# Patient Record
Sex: Female | Born: 2000 | Hispanic: Yes | Marital: Single | Smoking: Never smoker
Health system: Southern US, Community
[De-identification: ages and names within clinical notes are randomized; demographics above are authoritative.]

## PROBLEM LIST (undated history)

## (undated) DIAGNOSIS — E7800 Pure hypercholesterolemia, unspecified: Secondary | ICD-10-CM

## (undated) DIAGNOSIS — U071 COVID-19: Secondary | ICD-10-CM

## (undated) HISTORY — DX: COVID-19: U07.1

## (undated) HISTORY — DX: Pure hypercholesterolemia, unspecified: E78.00

## (undated) HISTORY — PX: OTHER SURGICAL HISTORY: SHX169

---
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
## 2019-02-12 ENCOUNTER — Ambulatory Visit: Payer: Medicaid Other | Attending: Internal Medicine

## 2019-02-12 DIAGNOSIS — U071 COVID-19: Secondary | ICD-10-CM | POA: Insufficient documentation

## 2019-02-12 DIAGNOSIS — Z20822 Contact with and (suspected) exposure to covid-19: Secondary | ICD-10-CM

## 2019-02-13 LAB — NOVEL CORONAVIRUS, NAA: SARS-CoV-2, NAA: DETECTED — AB

## 2019-02-24 ENCOUNTER — Other Ambulatory Visit: Payer: Self-pay

## 2019-02-24 ENCOUNTER — Ambulatory Visit: Payer: Medicaid Other

## 2019-02-24 VITALS — BP 98/62 | Ht 66.0 in | Wt 170.5 lb

## 2019-02-24 DIAGNOSIS — Z3201 Encounter for pregnancy test, result positive: Secondary | ICD-10-CM

## 2019-02-24 LAB — PREGNANCY, URINE: Preg Test, Ur: POSITIVE — AB

## 2019-02-24 NOTE — Progress Notes (Signed)
Pt unsure of prenatal caregiver; sent to preadmit.

## 2019-03-27 NOTE — Progress Notes (Signed)
Record abstracted per 03/26/19 phone interview with Hal Hope, RN; Sharlette Dense

## 2019-03-28 ENCOUNTER — Encounter: Payer: Self-pay | Admitting: Family Medicine

## 2019-03-28 ENCOUNTER — Other Ambulatory Visit: Payer: Self-pay

## 2019-03-28 ENCOUNTER — Ambulatory Visit: Payer: Medicaid Other | Admitting: Family Medicine

## 2019-03-28 VITALS — BP 106/69 | HR 80 | Temp 97.3°F | Wt 183.2 lb

## 2019-03-28 DIAGNOSIS — F1291 Cannabis use, unspecified, in remission: Secondary | ICD-10-CM

## 2019-03-28 DIAGNOSIS — Z87898 Personal history of other specified conditions: Secondary | ICD-10-CM

## 2019-03-28 DIAGNOSIS — O0930 Supervision of pregnancy with insufficient antenatal care, unspecified trimester: Secondary | ICD-10-CM | POA: Insufficient documentation

## 2019-03-28 DIAGNOSIS — Z3400 Encounter for supervision of normal first pregnancy, unspecified trimester: Secondary | ICD-10-CM

## 2019-03-28 HISTORY — DX: Encounter for supervision of normal first pregnancy, unspecified trimester: Z34.00

## 2019-03-28 HISTORY — DX: Supervision of pregnancy with insufficient antenatal care, unspecified trimester: O09.30

## 2019-03-28 LAB — URINALYSIS
Bilirubin, UA: NEGATIVE
Glucose, UA: NEGATIVE
Ketones, UA: NEGATIVE
Leukocytes,UA: NEGATIVE
Nitrite, UA: NEGATIVE
Protein,UA: NEGATIVE
RBC, UA: NEGATIVE
Specific Gravity, UA: 1.025 (ref 1.005–1.030)
Urobilinogen, Ur: 0.2 mg/dL (ref 0.2–1.0)
pH, UA: 7 (ref 5.0–7.5)

## 2019-03-28 LAB — WET PREP FOR TRICH, YEAST, CLUE
Trichomonas Exam: NEGATIVE
Yeast Exam: NEGATIVE

## 2019-03-28 LAB — HEMOGLOBIN, FINGERSTICK: Hemoglobin: 12.4 g/dL (ref 11.1–15.9)

## 2019-03-28 NOTE — Progress Notes (Signed)
Wet Prep results reviewed. Per standing orders no treatment indicated. Leeza Heiner, RN  

## 2019-03-28 NOTE — Progress Notes (Signed)
Here today for 20.1 week MH IP. Taking PNV Gummies QD. Denies ED/hospital visits since +PT. Quad Screen today. Declines Flu vaccine. Tawny Hopping, RN

## 2019-03-28 NOTE — Progress Notes (Addendum)
[place] [id number] [phone]  INITIAL PRENATAL VISIT NOTE  Subjective:  Patricia Kirby is a 19 y.o. G1P0000 at [redacted]w[redacted]d being seen today to start prenatal care at the [facility].  She is currently monitored for the following issues for this low-risk pregnancy and has Supervision of normal first pregnancy, antepartum and History of marijuana use on their problem list.   Pt presents for initial OB visit today. She currently lives by herself and works as a Advertising account planner. Physically she is feeling well, didn't really have nausea. She has no chronic medical issues and takes no medications daily aside from PNV. She last used alcohol and marijuana at the end of November when learning she was pregnant. Denies other drug use. She has no family hx of genetic abnormalities and elects the quad screen today. Mentally she states she is doing well. Phq-9 score 0, declines beh health referral.      Contractions: Not present. Vag. Bleeding: None.  Movement: Absent. Denies leaking of fluid.   Indications for ASA therapy (per uptodate) One of the following: Previous pregnancy with preeclampsia, especially early onset and with an adverse outcome No Multifetal gestation No Chronic hypertension No Type 1 or 2 diabetes mellitus No Chronic kidney disease No Autoimmune disease (antiphospholipid syndrome, systemic lupus erythematosus) No  Two or more of the following: Nulliparity Yes Obesity (body mass index >30 kg/m2) No Family history of preeclampsia in mother or sister No Age ?35 years No Sociodemographic characteristics (African American race, low socioeconomic level) Yes Personal risk factors (eg, previous pregnancy with low birth weight or small for gestational age infant, previous adverse pregnancy outcome [eg, stillbirth], interval >10 years between pregnancies) No   The  following portions of the patient's history were reviewed and updated as appropriate: allergies, current medications, past family history, past medical history, past social history, past surgical history and problem list. Problem list updated.  Objective:   Vitals:   03/28/19 0858  BP: 106/69  Pulse: 80  Temp: (!) 97.3 F (36.3 C)  Weight: 183 lb 3.2 oz (83.1 kg)    Fetal Status: Fetal Heart Rate (bpm): 130 Fundal Height: 20 cm Movement: Absent  Presentation: Undeterminable   Physical Exam Vitals and nursing note reviewed.  Constitutional:      General: She is not in acute distress.    Appearance: Normal appearance. She is well-developed.  HENT:     Head: Normocephalic and atraumatic.     Right Ear: External ear normal.     Left Ear: External ear normal.     Nose: Nose normal. No congestion or rhinorrhea.     Mouth/Throat:     Lips: Pink.     Mouth: Mucous membranes are moist.     Dentition: Normal dentition. No dental caries.     Pharynx: Oropharynx is clear. Uvula midline.  Eyes:     General: No scleral icterus.    Conjunctiva/sclera: Conjunctivae normal.  Neck:     Thyroid: No thyroid mass or thyromegaly.  Cardiovascular:     Rate and Rhythm: Normal rate.     Pulses: Normal pulses.     Comments: Extremities are warm and well perfused Pulmonary:     Effort: Pulmonary effort is normal.     Breath sounds: Normal breath sounds.  Chest:     Breasts: Breasts are symmetrical.        Right: Normal. No mass, nipple  discharge or skin change.        Left: Normal. No mass, nipple discharge or skin change.     Comments: +bilateral nipple peircings, no erythema or tenderness Abdominal:     General: Abdomen is flat.     Palpations: Abdomen is soft.     Tenderness: There is no abdominal tenderness.     Comments: Gravid   Genitourinary:    General: Normal vulva.     Exam position: Lithotomy position.     Pubic Area: No rash.      Labia:        Right: No rash.        Left:  No rash.      Vagina: Vaginal discharge (scant, white) present.     Cervix: No cervical motion tenderness or friability.     Uterus: Normal. Enlarged (Gravid 20 wk size ). Not tender.      Adnexa: Right adnexa normal and left adnexa normal.     Rectum: Normal. No external hemorrhoid.  Musculoskeletal:     Right lower leg: No edema.     Left lower leg: No edema.  Lymphadenopathy:     Upper Body:     Right upper body: No axillary adenopathy.     Left upper body: No axillary adenopathy.  Skin:    General: Skin is warm.     Capillary Refill: Capillary refill takes less than 2 seconds.  Neurological:     Mental Status: She is alert.     Assessment and Plan:  Pregnancy: G1P0000 at [redacted]w[redacted]d  1. Supervision of normal first pregnancy, antepartum -Initial prenatal visit today.  -Pt accepts quad genetic screening. -Anatomy US referral made today for [facility]. -Routine dental care recommended during pregnancy, handout of local dentists provided. -Pap will be due age 62 -She declines aspirin for preeclampsia prevention (recommended d/t risk factors of nulliparity and sociodemographic characteristics). - Hemoglobinopathy evaluation -[id number] - Prenatal profile without Varicella/Rubella (808811) - Urine Culture - Chlamydia/GC NAA, Confirmation - HIV [place] [facility] - QUAD Screen [facility] Only - WET PREP FOR TRICH, YEAST, CLUE - Hemoglobin, venipuncture - Urinalysis (Urine Dip) - 031594 Drug Screen  2. History of marijuana use -Patient endorses hx of marijuana use, last date of use early November upon learning of pregnancy. -Hep C drawn today. -Pt counseled on importance of continued cessation during pregnancy. Her desire is also to remain quit. -Pt agrees to UDS q trimester. - Hepatitis C Antibody  3. Late prenatal care -20 wks. Referred to [facility]  Discussed overview of care and coordination with inpatient delivery practices including [facility], [facility], [facility] and [facility].   Preterm  labor symptoms and general obstetric precautions including but not limited to vaginal bleeding, contractions, leaking of fluid and fetal movement were reviewed in detail with the patient.  Please refer to After Visit Summary for other counseling recommendations.   Return in about 4 weeks (around 04/25/2019) for routine prenatal care.  No future appointments.  Ann Held, PA-C

## 2019-03-29 LAB — HEPATITIS C ANTIBODY: Hep C Virus Ab: <0.1 s/co ratio (ref 0.0–0.9)

## 2019-03-30 LAB — URINE CULTURE

## 2019-04-01 LAB — CBC/D/PLT+RPR+RH+ABO+AB SCR
Basophils Absolute: 0 10*3/uL (ref 0.0–0.2)
Basos: 0 %
EOS (ABSOLUTE): 0 10*3/uL (ref 0.0–0.4)
Eos: 0 %
Hematocrit: 36.4 % (ref 34.0–46.6)
Hemoglobin: 12.2 g/dL (ref 11.1–15.9)
Hepatitis B Surface Ag: NEGATIVE
Immature Grans (Abs): 0 10*3/uL (ref 0.0–0.1)
Immature Granulocytes: 0 %
Lymphocytes Absolute: 1.1 10*3/uL (ref 0.7–3.1)
Lymphs: 12 %
MCH: 30.4 pg (ref 26.6–33.0)
MCHC: 33.5 g/dL (ref 31.5–35.7)
MCV: 91 fL (ref 79–97)
Monocytes Absolute: 0.6 10*3/uL (ref 0.1–0.9)
Monocytes: 6 %
Neutrophils Absolute: 7.3 10*3/uL — ABNORMAL HIGH (ref 1.4–7.0)
Neutrophils: 82 %
Platelets: 233 10*3/uL (ref 150–450)
RBC: 4.01 x10E6/uL (ref 3.77–5.28)
RDW: 12.9 % (ref 11.7–15.4)
RPR Ser Ql: REACTIVE — AB
Rh Factor: POSITIVE
WBC: 9.1 10*3/uL (ref 3.4–10.8)

## 2019-04-01 LAB — RPR, QUANT+TP ABS (REFLEX)
Rapid Plasma Reagin, Quant: 1:4 {titer} — ABNORMAL HIGH
T Pallidum Abs: NONREACTIVE

## 2019-04-01 LAB — CHLAMYDIA/GC NAA, CONFIRMATION
Chlamydia trachomatis, NAA: NEGATIVE
Neisseria gonorrhoeae, NAA: NEGATIVE

## 2019-04-01 LAB — AB SCR+ANTIBODY ID: Antibody Screen: POSITIVE — AB

## 2019-04-02 ENCOUNTER — Encounter: Payer: Self-pay | Admitting: Family Medicine

## 2019-04-02 DIAGNOSIS — R768 Other specified abnormal immunological findings in serum: Secondary | ICD-10-CM | POA: Insufficient documentation

## 2019-04-02 DIAGNOSIS — R899 Unspecified abnormal finding in specimens from other organs, systems and tissues: Secondary | ICD-10-CM | POA: Insufficient documentation

## 2019-04-02 LAB — HGB FRACTIONATION CASCADE
Hgb A2: 2.6 % (ref 1.8–3.2)
Hgb A: 97.4 % (ref 96.4–98.8)
Hgb F: 0 % (ref 0.0–2.0)
Hgb S: 0 %

## 2019-04-03 LAB — OB RESULTS CONSOLE HIV ANTIBODY (ROUTINE TESTING): HIV: NONREACTIVE

## 2019-04-03 LAB — HM HIV SCREENING LAB: HM HIV Screening: NEGATIVE

## 2019-04-04 ENCOUNTER — Encounter: Payer: Self-pay | Admitting: Family Medicine

## 2019-04-04 LAB — 789231 7+OXYCODONE-BUND
Amphetamines, Urine: NEGATIVE ng/mL
BENZODIAZ UR QL: NEGATIVE ng/mL
Barbiturate screen, urine: NEGATIVE ng/mL
Cocaine (Metab.): NEGATIVE ng/mL
OPIATE SCREEN URINE: NEGATIVE ng/mL
Oxycodone/Oxymorphone, Urine: NEGATIVE ng/mL
PCP Quant, Ur: NEGATIVE ng/mL

## 2019-04-04 LAB — CANNABINOID CONFIRMATION, UR
CANNABINOIDS: POSITIVE — AB
Carboxy THC GC/MS Conf: 29 ng/mL

## 2019-04-07 ENCOUNTER — Other Ambulatory Visit: Payer: Self-pay

## 2019-04-07 ENCOUNTER — Encounter: Payer: Self-pay | Admitting: Obstetrics and Gynecology

## 2019-04-07 ENCOUNTER — Telehealth: Payer: Self-pay

## 2019-04-07 ENCOUNTER — Observation Stay
Admission: EM | Admit: 2019-04-07 | Discharge: 2019-04-07 | Disposition: A | Discharge status: Home / Self Care | Payer: Medicaid Other | Attending: Obstetrics and Gynecology | Admitting: Obstetrics and Gynecology

## 2019-04-07 DIAGNOSIS — R899 Unspecified abnormal finding in specimens from other organs, systems and tissues: Secondary | ICD-10-CM

## 2019-04-07 DIAGNOSIS — O26892 Other specified pregnancy related conditions, second trimester: Secondary | ICD-10-CM

## 2019-04-07 DIAGNOSIS — Z3A21 21 weeks gestation of pregnancy: Secondary | ICD-10-CM | POA: Insufficient documentation

## 2019-04-07 DIAGNOSIS — R109 Unspecified abdominal pain: Secondary | ICD-10-CM | POA: Diagnosis not present

## 2019-04-07 HISTORY — DX: Other specified pregnancy related conditions, second trimester: O26.892

## 2019-04-07 NOTE — Telephone Encounter (Signed)
I was consulted and agree with RN Lynden Ang Stephenson's documentation.

## 2019-04-07 NOTE — OB Triage Note (Signed)
Patient G1P0 [redacted]w[redacted]d complains of "sharp abdominal pain" midline, right above the umbilicus. Patient states that it started last night at 1930 and was a 9/10 pain. She states it is better today than last night at a 8/10 pain. She states she had a steak, chicken, and shrimp burrito last night a brownie and milk this morning and beef tacos for lunch. Patient states she had sex over 2 days ago and has had regular bowel movements and denies urinary complaints. Patient denies vaginal bleeding and vaginal discharge. Patient states she just began prenatal care last week at the [facility]. She states she found out she was pregnant at 2 months but did not seek care because she was not sure of where to seek help. Patient states father of the baby is involved. Patient denies any other complaints or complications. Vital signs stable. Monitors applied and assessing.

## 2019-04-07 NOTE — Telephone Encounter (Signed)
Returned call-c/o pain @ umbilicus area since 7pm yesterday,"non stop", "worsens with laugh" and rates pain 8; reports has not felt FM yet; no bleeding/spotting or dysuria; consulted Maximiano Coss, PA and advised to go to ED-agreed to plan Sharlette Dense, RN

## 2019-04-07 NOTE — Progress Notes (Signed)
Discharge instructions reviewed with patient. Patient states her pain is starting to get better now and is comfortable going home. RN answered all questions and patient verbalized understanding.

## 2019-04-08 NOTE — Discharge Summary (Signed)
L&D OB Triage Note  [name] is a 19 y.o. G1P0000 female at [redacted]w[redacted]d, EDD Estimated Date of Delivery: 08/14/19 who presented to triage for complaints of middle abdominal pain x 1 day (near umbilicus).  She denied nausea, vomiting.  She was evaluated by the nurses with no significant findings significant. Vital signs stable. Fetal heart tones present.    NST INTERPRETATION: Indications: abdominal pain in pregnancy  Mode: External Baseline Rate (A): 150 bpm(fht)           Contraction Frequency (min): none  Impression: None.    Plan: Fetal heart tracing performed, somewhat discontinuous but fetal heart tones present.  She was discharged home, advised on decreasing intake of spicy foods, can take tums.  Also, discussed that fetal movement can be noted near umbilicus.  Continue routine prenatal care. Follow up with OB/GYN as previously scheduled.     Hildred Laser, MD  [facility]

## 2019-04-28 ENCOUNTER — Other Ambulatory Visit: Payer: Self-pay

## 2019-04-28 ENCOUNTER — Ambulatory Visit: Payer: Medicaid Other | Admitting: Family Medicine

## 2019-04-28 ENCOUNTER — Encounter: Payer: Self-pay | Admitting: Family Medicine

## 2019-04-28 VITALS — BP 102/61 | HR 84 | Temp 98.2°F | Wt 192.4 lb

## 2019-04-28 DIAGNOSIS — F1291 Cannabis use, unspecified, in remission: Secondary | ICD-10-CM

## 2019-04-28 DIAGNOSIS — Z87898 Personal history of other specified conditions: Secondary | ICD-10-CM

## 2019-04-28 DIAGNOSIS — Z3400 Encounter for supervision of normal first pregnancy, unspecified trimester: Secondary | ICD-10-CM

## 2019-04-28 DIAGNOSIS — R899 Unspecified abnormal finding in specimens from other organs, systems and tissues: Secondary | ICD-10-CM

## 2019-04-28 NOTE — Progress Notes (Addendum)
Here today for 24.4 week MH RV. Taking two PNV Gummies QD. Denies ED/hospital visits since last RV. Requests another ultrasound for fetal gender. Repeat Antibody screen today.  Tawny Hopping, RN

## 2019-04-28 NOTE — Progress Notes (Signed)
  PRENATAL VISIT NOTE  Subjective:  Patricia Kirby is a 19 y.o. G1P0000 at [redacted]w[redacted]d being seen today for ongoing prenatal care.  She is currently monitored for the following issues for this low-risk pregnancy and has Supervision of normal first pregnancy, antepartum; History of marijuana use; Late prenatal care; False positive syphilis serology; Antibody result positive, needs repeat in 4 wks; and Abdominal pain in pregnancy, second trimester on their problem list.  Patient reports no complaints.  Contractions: Not present. Vag. Bleeding: None.  Movement: Absent. Denies leaking of fluid/ROM.   The following portions of the patient's history were reviewed and updated as appropriate: allergies, current medications, past family history, past medical history, past social history, past surgical history and problem list. Problem list updated.  Objective:   Vitals:   04/28/19 1552  BP: 102/61  Pulse: 84  Temp: 98.2 F (36.8 C)  Weight: 192 lb 6.4 oz (87.3 kg)    Fetal Status: Fetal Heart Rate (bpm): 140 Fundal Height: 24 cm Movement: Absent     General:  Alert, oriented and cooperative. Patient is in no acute distress.  Skin: Skin is warm and dry. No rash noted.   Cardiovascular: Normal heart rate noted  Respiratory: Normal respiratory effort, no problems with respiration noted  Abdomen: Soft, gravid, appropriate for gestational age.  Pain/Pressure: Absent     Pelvic: Cervical exam deferred        Extremities: Normal range of motion.  Edema: None  Mental Status: Normal mood and affect. Normal behavior. Normal judgment and thought content.   Assessment and Plan:  Pregnancy: G1P0000 at [redacted]w[redacted]d   1. Supervision of normal first pregnancy, antepartum -Discussed anatomy US results - pt needs repeat b/t Apr 14-28 for genital views, pt will call [facility] and schedule. 25 lb 6.4 oz (11.5 kg) wt gain - encouraged walking as well as cutting back on juice and eating late at night.  2. Abnormal laboratory  test result -Repeating indeterminant antibody screen lab today.  - Antibody screen (lab [phone] per [facility])  3. History of marijuana use -UDS positive last visit, pt restates that last use was early Nov during which time she smoked ~4x/wk, but no MJ since learning of pregnancy. Encouraged pt to continue w/ complete cessation.    Preterm labor symptoms and general obstetric precautions including but not limited to vaginal bleeding, contractions, leaking of fluid and fetal movement were reviewed in detail with the patient. Please refer to After Visit Summary for other counseling recommendations.  Return in about 4 weeks (around 05/26/2019) for routine prenatal care.  Future Appointments  Date Time Provider Department Center  05/26/2019  3:30 PM Schuman, Jaquelyn Bitter, MD [facility] None    Ann Held, PA-C

## 2019-05-20 ENCOUNTER — Other Ambulatory Visit: Payer: Self-pay

## 2019-05-20 ENCOUNTER — Ambulatory Visit (HOSPITAL_COMMUNITY)
Admission: AD | Admit: 2019-05-20 | Discharge: 2019-05-20 | Disposition: A | Discharge status: Home / Self Care | Payer: Medicaid Other | Source: Other Acute Inpatient Hospital | Attending: Obstetrics and Gynecology | Admitting: Obstetrics and Gynecology

## 2019-05-20 ENCOUNTER — Observation Stay
Admission: EM | Admit: 2019-05-20 | Discharge: 2019-05-20 | Disposition: A | Discharge status: Other Acute Inpatient Hospital | Payer: Medicaid Other | Attending: Obstetrics and Gynecology | Admitting: Obstetrics and Gynecology

## 2019-05-20 ENCOUNTER — Encounter: Payer: Self-pay | Admitting: Obstetrics and Gynecology

## 2019-05-20 DIAGNOSIS — R899 Unspecified abnormal finding in specimens from other organs, systems and tissues: Secondary | ICD-10-CM

## 2019-05-20 DIAGNOSIS — O99820 Streptococcus B carrier state complicating pregnancy: Secondary | ICD-10-CM | POA: Insufficient documentation

## 2019-05-20 DIAGNOSIS — Z3A28 28 weeks gestation of pregnancy: Secondary | ICD-10-CM

## 2019-05-20 DIAGNOSIS — Z8616 Personal history of COVID-19: Secondary | ICD-10-CM | POA: Diagnosis not present

## 2019-05-20 DIAGNOSIS — O4700 False labor before 37 completed weeks of gestation, unspecified trimester: Secondary | ICD-10-CM | POA: Insufficient documentation

## 2019-05-20 DIAGNOSIS — O0932 Supervision of pregnancy with insufficient antenatal care, second trimester: Secondary | ICD-10-CM | POA: Diagnosis not present

## 2019-05-20 DIAGNOSIS — Z3A00 Weeks of gestation of pregnancy not specified: Secondary | ICD-10-CM | POA: Diagnosis not present

## 2019-05-20 DIAGNOSIS — Z3A27 27 weeks gestation of pregnancy: Secondary | ICD-10-CM | POA: Insufficient documentation

## 2019-05-20 LAB — URINALYSIS, ROUTINE W REFLEX MICROSCOPIC
Bilirubin Urine: NEGATIVE
Glucose, UA: NEGATIVE mg/dL
Hgb urine dipstick: NEGATIVE
Ketones, ur: 20 mg/dL — AB
Nitrite: NEGATIVE
Protein, ur: NEGATIVE mg/dL
Specific Gravity, Urine: 1.016 (ref 1.005–1.030)
pH: 7 (ref 5.0–8.0)

## 2019-05-20 LAB — CHLAMYDIA/NGC RT PCR (ARMC ONLY)
Chlamydia Tr: NOT DETECTED
N gonorrhoeae: NOT DETECTED

## 2019-05-20 LAB — RESPIRATORY PANEL BY RT PCR (FLU A&B, COVID)
Influenza A by PCR: NEGATIVE
Influenza B by PCR: NEGATIVE
SARS Coronavirus 2 by RT PCR: NEGATIVE

## 2019-05-20 LAB — URINE DRUG SCREEN, QUALITATIVE (ARMC ONLY)
Amphetamines, Ur Screen: NOT DETECTED
Barbiturates, Ur Screen: NOT DETECTED
Benzodiazepine, Ur Scrn: NOT DETECTED
Cannabinoid 50 Ng, Ur ~~LOC~~: NOT DETECTED
Cocaine Metabolite,Ur ~~LOC~~: NOT DETECTED
MDMA (Ecstasy)Ur Screen: NOT DETECTED
Methadone Scn, Ur: NOT DETECTED
Opiate, Ur Screen: NOT DETECTED
Phencyclidine (PCP) Ur S: NOT DETECTED
Tricyclic, Ur Screen: NOT DETECTED

## 2019-05-20 LAB — CBC
HCT: 33.6 % — ABNORMAL LOW (ref 36.0–46.0)
Hemoglobin: 11.3 g/dL — ABNORMAL LOW (ref 12.0–15.0)
MCH: 30.1 pg (ref 26.0–34.0)
MCHC: 33.6 g/dL (ref 30.0–36.0)
MCV: 89.6 fL (ref 80.0–100.0)
Platelets: 211 10*3/uL (ref 150–400)
RBC: 3.75 MIL/uL — ABNORMAL LOW (ref 3.87–5.11)
RDW: 12.7 % (ref 11.5–15.5)
WBC: 15.9 10*3/uL — ABNORMAL HIGH (ref 4.0–10.5)
nRBC: 0 % (ref 0.0–0.2)

## 2019-05-20 LAB — GROUP B STREP BY PCR: Group B strep by PCR: POSITIVE — AB

## 2019-05-20 LAB — WET PREP, GENITAL
Clue Cells Wet Prep HPF POC: NONE SEEN
Sperm: NONE SEEN
Trich, Wet Prep: NONE SEEN
Yeast Wet Prep HPF POC: NONE SEEN

## 2019-05-20 LAB — ABO/RH: ABO/RH(D): B POS

## 2019-05-20 LAB — OB RESULTS CONSOLE GC/CHLAMYDIA
Chlamydia: NEGATIVE
Gonorrhea: NEGATIVE

## 2019-05-20 MED ORDER — NIFEDIPINE 10 MG PO CAPS
ORAL_CAPSULE | ORAL | Status: AC
  Filled 2019-05-20: qty 3

## 2019-05-20 MED ORDER — ACETAMINOPHEN 325 MG PO TABS: 650.0000 mg | ORAL_TABLET | ORAL | Status: DC | PRN

## 2019-05-20 MED ORDER — LACTATED RINGERS IV SOLN: 500.0000 mL | INTRAVENOUS | Status: DC | PRN

## 2019-05-20 MED ORDER — MAGNESIUM SULFATE 40 GM/1000ML IV SOLN
2.0000 g/h | INTRAVENOUS | Status: DC
  Administered 2019-05-20: 17:00:00 2 g/h via INTRAVENOUS
  Filled 2019-05-20: qty 1000

## 2019-05-20 MED ORDER — NIFEDIPINE 10 MG PO CAPS: 10.0000 mg | ORAL_CAPSULE | Freq: Four times a day (QID) | ORAL | Status: DC

## 2019-05-20 MED ORDER — LACTATED RINGERS IV SOLN: INTRAVENOUS | Status: DC

## 2019-05-20 MED ORDER — NIFEDIPINE 10 MG PO CAPS
ORAL_CAPSULE | ORAL | Status: AC
  Filled 2019-05-20: qty 2

## 2019-05-20 MED ORDER — SOD CITRATE-CITRIC ACID 500-334 MG/5ML PO SOLN: 30.0000 mL | ORAL | Status: DC | PRN

## 2019-05-20 MED ORDER — MAGNESIUM SULFATE BOLUS VIA INFUSION
4.0000 g | Freq: Once | INTRAVENOUS | Status: AC
  Administered 2019-05-20: 4 g via INTRAVENOUS
  Filled 2019-05-20: qty 1000

## 2019-05-20 MED ORDER — NIFEDIPINE 10 MG PO CAPS
30.0000 mg | ORAL_CAPSULE | Freq: Once | ORAL | Status: AC
  Administered 2019-05-20: 30 mg via ORAL

## 2019-05-20 MED ORDER — SODIUM CHLORIDE 0.9 % IV SOLN
2.0000 g | Freq: Once | INTRAVENOUS | Status: AC
  Administered 2019-05-20: 2 g via INTRAVENOUS
  Filled 2019-05-20: qty 2000

## 2019-05-20 MED ORDER — ACETAMINOPHEN 500 MG PO TABS
1000.0000 mg | ORAL_TABLET | Freq: Four times a day (QID) | ORAL | Status: DC | PRN
  Administered 2019-05-20: 1000 mg via ORAL
  Filled 2019-05-20: qty 2

## 2019-05-20 MED ORDER — BETAMETHASONE SOD PHOS & ACET 6 (3-3) MG/ML IJ SUSP
INTRAMUSCULAR | Status: AC
  Administered 2019-05-20: 30 mg via INTRAMUSCULAR
  Filled 2019-05-20: qty 5

## 2019-05-20 MED ORDER — NIFEDIPINE 10 MG PO CAPS
20.0000 mg | ORAL_CAPSULE | Freq: Once | ORAL | Status: AC
  Administered 2019-05-20: 20 mg via ORAL
  Filled 2019-05-20: qty 2

## 2019-05-20 MED ORDER — ONDANSETRON HCL 4 MG/2ML IJ SOLN: 4.0000 mg | Freq: Four times a day (QID) | INTRAMUSCULAR | Status: DC | PRN

## 2019-05-20 MED ORDER — SODIUM CHLORIDE 0.9 % IV SOLN
1.0000 g | INTRAVENOUS | Status: DC
  Administered 2019-05-20: 1 g via INTRAVENOUS
  Filled 2019-05-20: qty 1000

## 2019-05-20 MED ORDER — TERBUTALINE SULFATE 1 MG/ML IJ SOLN
INTRAMUSCULAR | Status: AC
  Filled 2019-05-20: qty 1

## 2019-05-20 MED ORDER — BETAMETHASONE SOD PHOS & ACET 6 (3-3) MG/ML IJ SUSP: 12.0000 mg | INTRAMUSCULAR | Status: DC

## 2019-05-20 MED ORDER — OXYTOCIN 40 UNITS IN NORMAL SALINE INFUSION - SIMPLE MED: 2.5000 [IU]/h | INTRAVENOUS | Status: DC

## 2019-05-20 MED ORDER — BUTORPHANOL TARTRATE 1 MG/ML IJ SOLN
1.0000 mg | INTRAMUSCULAR | Status: DC | PRN
  Administered 2019-05-20: 1 mg via INTRAVENOUS
  Filled 2019-05-20: qty 1

## 2019-05-20 MED ORDER — OXYTOCIN BOLUS FROM INFUSION: 500.0000 mL | Freq: Once | INTRAVENOUS | Status: DC

## 2019-05-20 MED ORDER — NIFEDIPINE ER OSMOTIC RELEASE 30 MG PO TB24
ORAL_TABLET | ORAL | Status: AC
  Filled 2019-05-20: qty 1

## 2019-05-20 MED ORDER — LIDOCAINE HCL (PF) 1 % IJ SOLN: 30.0000 mL | INTRAMUSCULAR | Status: DC | PRN

## 2019-05-20 NOTE — Progress Notes (Signed)
Patient ID: Patricia Kirby, female   DOB: 12/11/2000, 19 y.o.   MRN: [id number]  LABOR NOTE   Patricia Kirby 18 y.o.@ at [redacted]w[redacted]d  SUBJECTIVE:  Pt much more comfortable.  Feels occasional contractions.  No pelvic pressure.    OBJECTIVE:  BP (!) 100/48 (BP Location: Left Arm)   Pulse (!) 117   Temp 99 F (37.2 C) (Oral)   Resp 16   LMP 11/07/2018 (Within Days) Comment: normal Total I/O In: -  Out: 475 [Urine:475]  She has not shown cervical change in 3 hours CERVIX:  SVE:   Dilation: 6 Effacement (%): 100 Station: -2 Exam by:: MBS CONTRACTIONS: irregular, every 3-10 minutes FHR: Fetal heart tracing reviewed. Variability: Good {> 6 bpm) Category I    Labs: Lab Results  Component Value Date   WBC 15.9 (H) 05/20/2019   HGB 11.3 (L) 05/20/2019   HCT 33.6 (L) 05/20/2019   MCV 89.6 05/20/2019   PLT 211 05/20/2019    ASSESSMENT: 1) Labor curve reviewed.       Progress: No recent cervical change despite preterm labor eairlier     Membranes: intact     GBS positive      2) 27 wk IUP , vertex  Active Problems:   Indication for care in labor and delivery, antepartum   Preterm labor   PLAN:  Transfer to [facility] - accepting MD = Knittle. Continue Mg Continue procardia  Elonda Husky, M.D. 05/20/2019 10:16 PM

## 2019-05-20 NOTE — H&P (Addendum)
History and Physical   HPI  Patricia Kirby is a 19 y.o. G1P0000 at [redacted]w[redacted]d Estimated Date of Delivery: 08/14/19 who is being admitted for Preterm labor patient states that she began contracting earlier today and she describes specific tightenings of her abdomen.  She reports no leakage of fluid, no vaginal bleeding.  She reports normal fetal movement. [facility] and was planning to transfer her care but has not establish care elsewhere at this point.   OB History  OB History  Gravida Para Term Preterm AB Living  1 0 0 0 0 0  SAB TAB Ectopic Multiple Live Births  0 0 0 0 0    # Outcome Date GA Lbr Len/2nd Weight Sex Delivery Anes PTL Lv  1 Current             PROBLEM LIST  Pregnancy complications or risks: Patient Active Problem List   Diagnosis Date Noted  . Indication for care in labor and delivery, antepartum 05/20/2019  . Preterm labor 05/20/2019  . Abdominal pain in pregnancy, second trimester 04/07/2019  . 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 Antibody result positive, needs repeat in 4 wks 04/02/2019  . Supervision of normal first pregnancy, antepartum 03/28/2019  . History of marijuana use 03/28/2019  . Late prenatal care 03/28/2019    Prenatal labs and studies: ABO, Rh: B/Positive/-- (03/05 1100) Antibody: Positive, See Final Results (03/05 1100) Rubella:   RPR: Reactive (03/05 1100)  HBsAg: Negative (03/05 1100)  HIV:    GBS:    Past Medical History:  Diagnosis Date  . COVID-19 virus detected    Jan. 2021  . High cholesterol    during teenage years  . Preterm labor 05/20/2019     Past Surgical History:  Procedure Laterality Date  . denies    . Denies surgical history       Medications    Current Discharge Medication List    CONTINUE these medications which have NOT CHANGED   Details  Prenatal Vit-Fe Fumarate-FA (PRENATAL VITAMINS PO) Take 2 tablets by mouth. 2 gummies per day          Allergies  Patient has no known allergies.  Review of Systems  Pertinent items noted in HPI and remainder of comprehensive ROS otherwise negative.  Physical Exam  BP 117/66 (BP Location: Right Arm)   Pulse (!) 102   Temp 98.8 F (37.1 C) (Oral)   Resp 18   LMP 11/07/2018 (Within Days) Comment: normal  Lungs:  CTA B Cardio: RRR without M/R/G Abd: Soft, gravid, NT Presentation: cephalic -bedside ultrasound performed-vertex presentation. EXT: No C/C/ 1+ Edema DTRs: 2+ B CERVIX: Dilation: 4.5 Effacement (%): 80 Cervical Position: Anterior Presentation: Undeterminable Exam by:: Laurell Roof, RN     FHR:  Variability: Good {> 6 bpm) -appropriate for gestational age  Toco: Uterine Contractions: Every 2 to 5 minutes.  Mild to moderate.  Patient does not appear uncomfortable.  Test Results  Results for orders placed or performed during the hospital encounter of 05/20/19 (from the past 24 hour(s))  Urinalysis, Routine w reflex microscopic     Status: Abnormal   Collection Time: 05/20/19  2:49 PM  Result Value Ref Range   Color, Urine YELLOW (A) YELLOW   APPearance HAZY (A) CLEAR   Specific Gravity, Urine 1.016 1.005 - 1.030   pH 7.0 5.0 - 8.0   Glucose, UA NEGATIVE NEGATIVE mg/dL   Hgb  urine dipstick NEGATIVE NEGATIVE   Bilirubin Urine NEGATIVE NEGATIVE   Ketones, ur 20 (A) NEGATIVE mg/dL   Protein, ur NEGATIVE NEGATIVE mg/dL   Nitrite NEGATIVE NEGATIVE   Leukocytes,Ua LARGE (A) NEGATIVE   RBC / HPF 0-5 0 - 5 RBC/hpf   WBC, UA 11-20 0 - 5 WBC/hpf   Bacteria, UA RARE (A) NONE SEEN   Squamous Epithelial / LPF 0-5 0 - 5   Mucus PRESENT   Wet prep, genital     Status: Abnormal   Collection Time: 05/20/19  2:49 PM   Specimen: Vaginal  Result Value Ref Range   Yeast Wet Prep HPF POC NONE SEEN NONE SEEN   Trich, Wet Prep NONE SEEN NONE SEEN   Clue Cells Wet Prep HPF POC NONE SEEN NONE SEEN   WBC, Wet Prep HPF POC MANY (A) NONE SEEN   Sperm NONE SEEN     GBS unknown  Assessment   G1P0000 at [redacted]w[redacted]d Estimated Date of Delivery: 08/14/19  The fetus is reassuring.   Patient Active Problem List   Diagnosis Date Noted  . Indication for care in labor and delivery, antepartum 05/20/2019  . Preterm labor 05/20/2019  . Abdominal pain in pregnancy, second trimester 04/07/2019  . 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 Antibody result positive, needs repeat in 4 wks 04/02/2019  . Supervision of normal first pregnancy, antepartum 03/28/2019  . History of marijuana use 03/28/2019  . Late prenatal care 03/28/2019    Plan  1. Admit to L&D :   2. EFM: -- Category 1 3.  Procardia, IV hydration followed by a magnesium 4. Admission labs  5.  Betamethasone 6.  Antibiotics for GBS prophylaxis 7.  Neonatal consultation 8.  Consider transfer if patient and baby stable without rapid cervical change (NICU-level 3) 9.  Wet prep 10 Rapid GBS 11 GC/CT 12 Tox screen  I spent 50 minutes involved in the care of this patient preparing to see the patient by obtaining and reviewing her medical history (including labs, imaging tests and prior procedures), documenting clinical information in the electronic health record (EHR), counseling and coordinating care plans, writing and sending prescriptions, ordering tests or procedures and directly communicating with the patient by discussing pertinent items from her history and physical exam as well as detailing my assessment and plan as noted above so that she has an informed understanding.  Preterm labor management, treatment and possible outcomes discussed.  Possible delivery and transfer discussed. Complications of fetal prematurity discussed. All of her questions were answered.  Elonda Husky, M.D. 05/20/2019 3:53 PM

## 2019-05-20 NOTE — Progress Notes (Signed)
Discharge instructions given. Pt transfer to [facility]. Report given to [facility] L&D charge RN and to transfer team.

## 2019-05-20 NOTE — Consult Note (Signed)
[facility] [place]  Consultation Service: Neonatology    Dr. Logan Bores has asked for a consultation on Ms. Pharr with MRN# [id number]  regarding the care of a premature infant at [redacted]w[redacted]d gestation.   Ms. Maciolek is a 19 y.o. G1P0000 with an Estimated Date of Delivery of 08/14/19.  Pregnancy has been complicated by preterm labor, GBS history of marijuana use, false positive syphilis serology, and late prenatal care.    Prenatal labs and studies: ABO, Rh: B/Positive/-- (03/05 1100) Antibody: Positive, See Final Results (03/05 1100) Rubella:   RPR: Reactive (03/05 1100)  HBsAg: Negative (03/05 1100)  HIV:   Negative GBS: Positive by PCR  Ms. Kitamura and her partner, father of the baby, were present during the consultation. We discussed anticipated delivery room management including delivery team,  resuscitation, and stabilization of infant for transport to a tertiary center level III or greater.  I spoke to the development of the preterm infant and expected management of the ELBW newborn including temperature support, respiratory support, glucose support and fluid management. All of parents questions addressed and left parents when satisfied.   Molleigh Huot P, NNP-BC

## 2019-05-20 NOTE — OB Triage Note (Signed)
Pt. presents to Labor & Delivery with complaints of "sharp" lower abdominal pain rated 8/10 that radiates to her back since 11:00 AM yesterday morning. She states, "The pain comes and starts in my lower belly, and as it builds, it moves to my lower back, lasts for about a minute, and then fades away for a few minutes before it comes back again." Pt. denies LOF or vaginal bleeding, but states she does have a large amount of mucous-like yellow discharge, requiring multiple panty-liner changes per day. Denies painful urination, frequency, or urgency, but states she does have a sharp pain in her bladder upon completion of her urinary stream. States baby is moving as expected. Vital signs WNL. External FM and Toco applied. Will continue to assess.

## 2019-05-21 DIAGNOSIS — Z3493 Encounter for supervision of normal pregnancy, unspecified, third trimester: Secondary | ICD-10-CM | POA: Insufficient documentation

## 2019-05-21 LAB — TYPE AND SCREEN
ABO/RH(D): B POS
Antibody Screen: POSITIVE
Unit division: 0
Unit division: 0

## 2019-05-21 LAB — BPAM RBC
Blood Product Expiration Date: 202105212359
Blood Product Expiration Date: 202105262359
Unit Type and Rh: 1700
Unit Type and Rh: 7300

## 2019-05-21 LAB — RPR
RPR Ser Ql: REACTIVE — AB
RPR Titer: 1:2 {titer}

## 2019-05-22 LAB — VARICELLA ZOSTER ANTIBODY, IGG: Varicella IgG: 393 index (ref >165)

## 2019-05-22 LAB — T.PALLIDUM AB, TOTAL: T Pallidum Abs: NONREACTIVE

## 2019-05-22 LAB — RUBELLA ANTIBODY, IGM: Rubella IgM: <20 AU/mL (ref 0.0–19.9)

## 2019-05-22 NOTE — Discharge Summary (Signed)
Patricia Kirby 18 y.o.@ at [redacted]w[redacted]d  SUBJECTIVE:  Pt much more comfortable.  Feels occasional contractions.  No pelvic pressure.    OBJECTIVE:  BP (!) 100/48 (BP Location: Left Arm)   Pulse (!) 117   Temp 99 F (37.2 C) (Oral)   Resp 16   LMP 11/07/2018 (Within Days) Comment: normal Total I/O In: -  Out: 475 [Urine:475]  She has not shown cervical change in 3 hours CERVIX:  SVE:   Dilation: 6 Effacement (%): 100 Station: -2 Exam by:: MBS CONTRACTIONS: irregular, every 3-10 minutes FHR: Fetal heart tracing reviewed. Variability: Good {> 6 bpm) Category I               Labs: Recent Labs       Lab Results  Component Value Date   WBC 15.9 (H) 05/20/2019   HGB 11.3 (L) 05/20/2019   HCT 33.6 (L) 05/20/2019   MCV 89.6 05/20/2019   PLT 211 05/20/2019      ASSESSMENT: 1) Labor curve reviewed.       Progress: No recent cervical change despite preterm labor eairlier     Membranes: intact     GBS positive      2) 27 wk IUP , vertex  Active Problems:   Indication for care in labor and delivery, antepartum   Preterm labor   PLAN:  Transfer to [facility] - accepting MD = Knittle. Continue Mg Continue procardia

## 2019-05-24 DIAGNOSIS — Z8616 Personal history of COVID-19: Secondary | ICD-10-CM | POA: Insufficient documentation

## 2019-05-26 ENCOUNTER — Encounter: Payer: Self-pay | Admitting: Obstetrics and Gynecology

## 2020-01-31 ENCOUNTER — Other Ambulatory Visit: Payer: Self-pay

## 2020-01-31 ENCOUNTER — Other Ambulatory Visit: Payer: Medicaid Other

## 2020-01-31 DIAGNOSIS — Z20822 Contact with and (suspected) exposure to covid-19: Secondary | ICD-10-CM

## 2020-02-03 LAB — NOVEL CORONAVIRUS, NAA: SARS-CoV-2, NAA: NOT DETECTED

## 2020-02-06 ENCOUNTER — Other Ambulatory Visit: Payer: Medicaid Other

## 2020-02-06 DIAGNOSIS — Z20822 Contact with and (suspected) exposure to covid-19: Secondary | ICD-10-CM

## 2020-02-08 LAB — SARS-COV-2, NAA 2 DAY TAT

## 2020-02-08 LAB — NOVEL CORONAVIRUS, NAA: SARS-CoV-2, NAA: NOT DETECTED

## 2020-02-17 ENCOUNTER — Other Ambulatory Visit: Payer: Medicaid Other

## 2020-04-13 ENCOUNTER — Emergency Department: Payer: Medicaid Other

## 2020-04-13 ENCOUNTER — Emergency Department
Admission: EM | Admit: 2020-04-13 | Discharge: 2020-04-13 | Discharge status: Against Medical Advice | Payer: Medicaid Other | Attending: Emergency Medicine | Admitting: Emergency Medicine

## 2020-04-13 ENCOUNTER — Other Ambulatory Visit: Payer: Self-pay

## 2020-04-13 DIAGNOSIS — Z87891 Personal history of nicotine dependence: Secondary | ICD-10-CM | POA: Insufficient documentation

## 2020-04-13 DIAGNOSIS — R102 Pelvic and perineal pain: Secondary | ICD-10-CM | POA: Diagnosis present

## 2020-04-13 DIAGNOSIS — Z8616 Personal history of COVID-19: Secondary | ICD-10-CM | POA: Diagnosis not present

## 2020-04-13 LAB — WET PREP, GENITAL
Clue Cells Wet Prep HPF POC: NONE SEEN
Sperm: NONE SEEN
Trich, Wet Prep: NONE SEEN
Yeast Wet Prep HPF POC: NONE SEEN

## 2020-04-13 NOTE — ED Provider Notes (Cosign Needed)
[facility] Emergency Department Provider Note  ____________________________________________   Event Date/Time   First MD Initiated Contact with Patient 04/13/20 1327     (approximate)  I have reviewed the triage vital signs and the nursing notes.   HISTORY  Chief Complaint Vaginal Pain    HPI Patricia Kirby is a 20 y.o. female presents emergency department complaining of pelvic pain.  Patient had an IUD placed approximately 1 week ago.  States she is having pelvic pain and cannot feel the strings.  Does not feel like it is worse supposed to be.  No vaginal bleeding, no vaginal discharge.  She does have pain with sex.  No fever or chills    Past Medical History:  Diagnosis Date  . COVID-19 virus detected    Jan. 2021  . High cholesterol    during teenage years  . Preterm labor 05/20/2019    Patient Active Problem List   Diagnosis Date Noted  . Pregnancy with prenatal care elsewhere in third trimester 05/21/2019  . Indication for care in labor and delivery, antepartum 05/20/2019  . Preterm labor 05/20/2019  . Abdominal pain in pregnancy, second trimester 04/07/2019  . 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 Supervision of normal first pregnancy, antepartum 03/28/2019  . History of marijuana use 03/28/2019  . Late prenatal care 03/28/2019    Past Surgical History:  Procedure Laterality Date  . denies    . Denies surgical history      Prior to Admission medications   Medication Sig Start Date End Date Taking? Authorizing Provider  Prenatal Vit-Fe Fumarate-FA (PRENATAL VITAMINS PO) Take 2 tablets by mouth. 2 gummies per day    [provider]    Allergies Patient has no known allergies.  Family History  Problem Relation Age of Onset  . High Cholesterol Maternal Grandmother   . Hypertension Maternal Grandmother   . Diabetes Maternal Grandmother   . Asthma Sister   . Asthma Brother   . Hepatitis Maternal Grandfather      Social History Social History   Tobacco Use  . Smoking status: Former Games developer  . Smokeless tobacco: Never Used  . Tobacco comment: quit end of November 2020  Vaping Use  . Vaping Use: Former  Substance Use Topics  . Alcohol use: Not Currently  . Drug use: Not Currently    Types: Marijuana    Comment: Before known pregnant approx. q. day-MJ    Review of Systems  Constitutional: No fever/chills Eyes: No visual changes. ENT: No sore throat. Respiratory: Denies cough Cardiovascular: Denies chest pain Gastrointestinal: Denies abdominal pain Genitourinary: Negative for dysuria.  Positive pelvic pain Musculoskeletal: Negative for back pain. Skin: Negative for rash. Psychiatric: no mood changes,     ____________________________________________   PHYSICAL EXAM:  VITAL SIGNS: ED Triage Vitals [04/13/20 1250]  Enc Vitals Group     BP 123/68     Pulse Rate (!) 53     Resp 17     Temp 98.1 F (36.7 C)     Temp Source Oral     SpO2 100 %     Weight 172 lb (78 kg)     Height 5\' 6"  (1.676 m)     Head Circumference      Peak Flow      Pain Score 6     Pain Loc      Pain Edu?      Excl. in GC?     Constitutional: Alert and  oriented. Well appearing and in no acute distress. Eyes: Conjunctivae are normal.  Head: Atraumatic. Nose: No congestion/rhinnorhea. Mouth/Throat: Mucous membranes are moist.   Neck:  supple no lymphadenopathy noted Cardiovascular: Normal rate, regular rhythm. Heart sounds are normal Respiratory: Normal respiratory effort.  No retractions, lungs c t a  Abd: soft tender in the pelvic area, bs normal all 4 quad GU: Slight amount of discharge noted at the cervix, strings from the IUD are noted but are very short Musculoskeletal: FROM all extremities, warm and well perfused Neurologic:  Normal speech and language.  Skin:  Skin is warm, dry and intact. No rash noted. Psychiatric: Mood and affect are normal. Speech and behavior are  normal.  ____________________________________________   LABS (all labs ordered are listed, but only abnormal results are displayed)  Labs Reviewed  WET PREP, GENITAL - Abnormal; Notable for the following components:      Result Value   WBC, Wet Prep HPF POC FEW (*)    All other components within normal limits   ____________________________________________   ____________________________________________  RADIOLOGY  Abdomen 1 view Ultrasound pelvis  ____________________________________________   PROCEDURES  Procedure(s) performed: No  Procedures    ____________________________________________   INITIAL IMPRESSION / ASSESSMENT AND PLAN / ED COURSE  Pertinent labs & imaging results that were available during my care of the patient were reviewed by me and considered in my medical decision making (see chart for details).   Patient is 20 year old female presents with pelvic pain and questionable displacement of IUD.  See HPI.  Physical exam shows patient per stable  Abdomen 1 view for IUD placement   Abdomen 1 view shows IUD in the central pelvis,  Spoke with Dr. Bonney Aid via secure chat.  He would prefer to have a ultrasound for placement.  Wet prep is negative  Explained to the patient we need to do an ultrasound.  She agrees.  Went to check on the patient she is no longer in the room.  Spoke with ultrasound she is not in the ultrasound area.  She was marked as eloped from the ED.  KLAIRA Kirby was evaluated in Emergency Department on 04/13/2020 for the symptoms described in the history of present illness. She was evaluated in the context of the global COVID-19 pandemic, which necessitated consideration that the patient might be at risk for infection with the SARS-CoV-2 virus that causes COVID-19. Institutional protocols and algorithms that pertain to the evaluation of patients at risk for COVID-19 are in a state of rapid change based on information released by  regulatory bodies including the CDC and federal and state organizations. These policies and algorithms were followed during the patient's care in the ED.    As part of my medical decision making, I reviewed the following data within the electronic MEDICAL RECORD NUMBER Nursing notes reviewed and incorporated, Old chart reviewed, Radiograph reviewed , Notes from prior ED visits and [place] Controlled Substance Database  ____________________________________________   FINAL CLINICAL IMPRESSION(S) / ED DIAGNOSES  Final diagnoses:  Pelvic pain      NEW MEDICATIONS STARTED DURING THIS VISIT:  Discharge Medication List as of 04/13/2020  4:41 PM       Note:  This document was prepared using Dragon voice recognition software and may include unintentional dictation errors.    Faythe Ghee, PA-C 04/13/20 1704

## 2020-04-13 NOTE — ED Notes (Signed)
Chaperoned pelvic exam with PA. Wet prep collected and sent.

## 2020-04-13 NOTE — ED Notes (Signed)
Provided cup for urine sample.

## 2020-04-13 NOTE — ED Triage Notes (Signed)
Pt arrives to ER stating that last week she had an IUD placed at [facility]. States pain and "doesn't feel like it's where it's supposed to be." A&O, ambulatory. Denies vaginal bleeding. Pt states pain increased with sexual intercourse.

## 2020-04-13 NOTE — ED Notes (Signed)
Went to collect POC urine per provider request. Patient was not to be found. Checked bathroom and all surrounding areas. Patient left without being discharged. Darl Pikes PA notified.

## 2020-04-14 ENCOUNTER — Encounter (HOSPITAL_COMMUNITY): Payer: Self-pay | Admitting: Emergency Medicine

## 2020-04-14 ENCOUNTER — Other Ambulatory Visit: Payer: Self-pay

## 2020-04-14 ENCOUNTER — Emergency Department (HOSPITAL_COMMUNITY)
Admission: EM | Admit: 2020-04-14 | Discharge: 2020-04-14 | Disposition: A | Discharge status: Home / Self Care | Payer: Medicaid Other | Attending: Emergency Medicine | Admitting: Emergency Medicine

## 2020-04-14 DIAGNOSIS — N939 Abnormal uterine and vaginal bleeding, unspecified: Secondary | ICD-10-CM | POA: Insufficient documentation

## 2020-04-14 DIAGNOSIS — Z30430 Encounter for insertion of intrauterine contraceptive device: Secondary | ICD-10-CM | POA: Diagnosis not present

## 2020-04-14 DIAGNOSIS — Z5321 Procedure and treatment not carried out due to patient leaving prior to being seen by health care provider: Secondary | ICD-10-CM | POA: Insufficient documentation

## 2020-04-14 NOTE — ED Triage Notes (Addendum)
Pt states she had an IUD placed 1 week ago and feels like it has shifted.  Seen at [facility] ED yesterday for same.  States she wasn't bleeding yesterday but now having vaginal bleeding.

## 2020-04-14 NOTE — ED Notes (Signed)
Pt called 3 times, no answer 

## 2020-04-14 NOTE — ED Notes (Signed)
I called pt X3 no answer to take back to her to room.

## 2020-07-03 ENCOUNTER — Emergency Department
Admission: EM | Admit: 2020-07-03 | Discharge: 2020-07-03 | Disposition: A | Discharge status: Home / Self Care | Payer: Medicaid Other | Attending: Emergency Medicine | Admitting: Emergency Medicine

## 2020-07-03 ENCOUNTER — Encounter: Payer: Self-pay | Admitting: Emergency Medicine

## 2020-07-03 ENCOUNTER — Emergency Department: Payer: Medicaid Other

## 2020-07-03 ENCOUNTER — Other Ambulatory Visit: Payer: Self-pay

## 2020-07-03 DIAGNOSIS — Z8616 Personal history of COVID-19: Secondary | ICD-10-CM | POA: Diagnosis not present

## 2020-07-03 DIAGNOSIS — Z20822 Contact with and (suspected) exposure to covid-19: Secondary | ICD-10-CM | POA: Insufficient documentation

## 2020-07-03 DIAGNOSIS — R1031 Right lower quadrant pain: Secondary | ICD-10-CM | POA: Diagnosis present

## 2020-07-03 DIAGNOSIS — R110 Nausea: Secondary | ICD-10-CM | POA: Insufficient documentation

## 2020-07-03 DIAGNOSIS — Z87891 Personal history of nicotine dependence: Secondary | ICD-10-CM | POA: Insufficient documentation

## 2020-07-03 LAB — RESP PANEL BY RT-PCR (FLU A&B, COVID) ARPGX2
Influenza A by PCR: NEGATIVE
Influenza B by PCR: NEGATIVE
SARS Coronavirus 2 by RT PCR: NEGATIVE

## 2020-07-03 LAB — URINALYSIS, COMPLETE (UACMP) WITH MICROSCOPIC
Bacteria, UA: NONE SEEN
Bilirubin Urine: NEGATIVE
Glucose, UA: NEGATIVE mg/dL
Hgb urine dipstick: NEGATIVE
Ketones, ur: NEGATIVE mg/dL
Nitrite: NEGATIVE
Protein, ur: NEGATIVE mg/dL
Specific Gravity, Urine: 1.024 (ref 1.005–1.030)
pH: 7 (ref 5.0–8.0)

## 2020-07-03 LAB — PREGNANCY, URINE: Preg Test, Ur: NEGATIVE

## 2020-07-03 LAB — COMPREHENSIVE METABOLIC PANEL
ALT: 13 U/L (ref 0–44)
AST: 18 U/L (ref 15–41)
Albumin: 3.7 g/dL (ref 3.5–5.0)
Alkaline Phosphatase: 75 U/L (ref 38–126)
Anion gap: 7 (ref 5–15)
BUN: 21 mg/dL — ABNORMAL HIGH (ref 6–20)
CO2: 25 mmol/L (ref 22–32)
Calcium: 9 mg/dL (ref 8.9–10.3)
Chloride: 107 mmol/L (ref 98–111)
Creatinine, Ser: 0.67 mg/dL (ref 0.44–1.00)
GFR, Estimated: >60 mL/min (ref >60)
Glucose, Bld: 105 mg/dL — ABNORMAL HIGH (ref 70–99)
Potassium: 3.9 mmol/L (ref 3.5–5.1)
Sodium: 139 mmol/L (ref 135–145)
Total Bilirubin: 0.6 mg/dL (ref 0.3–1.2)
Total Protein: 7.9 g/dL (ref 6.5–8.1)

## 2020-07-03 LAB — CBC
HCT: 38 % (ref 36.0–46.0)
Hemoglobin: 12.5 g/dL (ref 12.0–15.0)
MCH: 28.7 pg (ref 26.0–34.0)
MCHC: 32.9 g/dL (ref 30.0–36.0)
MCV: 87.2 fL (ref 80.0–100.0)
Platelets: 241 10*3/uL (ref 150–400)
RBC: 4.36 MIL/uL (ref 3.87–5.11)
RDW: 13.2 % (ref 11.5–15.5)
WBC: 11.8 10*3/uL — ABNORMAL HIGH (ref 4.0–10.5)
nRBC: 0 % (ref 0.0–0.2)

## 2020-07-03 LAB — LIPASE, BLOOD: Lipase: 34 U/L (ref 11–51)

## 2020-07-03 MED ORDER — HYOSCYAMINE SULFATE 0.125 MG PO TBDP: 0.1250 mg | ORAL_TABLET | Freq: Four times a day (QID) | ORAL | 0 refills | Status: DC | PRN

## 2020-07-03 MED ORDER — IOHEXOL 300 MG/ML  SOLN
100.0000 mL | Freq: Once | INTRAMUSCULAR | Status: AC | PRN
  Administered 2020-07-03: 100 mL via INTRAVENOUS
  Filled 2020-07-03: qty 100

## 2020-07-03 MED ORDER — FENTANYL CITRATE (PF) 100 MCG/2ML IJ SOLN
50.0000 ug | Freq: Once | INTRAMUSCULAR | Status: AC
  Administered 2020-07-03: 50 ug via INTRAVENOUS
  Filled 2020-07-03: qty 2

## 2020-07-03 MED ORDER — SODIUM CHLORIDE 0.9 % IV BOLUS
1000.0000 mL | Freq: Once | INTRAVENOUS | Status: AC
  Administered 2020-07-03: 1000 mL via INTRAVENOUS

## 2020-07-03 NOTE — ED Triage Notes (Signed)
Pt to ED via POV c/o RUQ abd pain x 1 week. Pt denies any other symptoms at this time. Pt is in NAD.

## 2020-07-03 NOTE — ED Provider Notes (Signed)
[facility] Emergency Department Provider Note ____________________________________________   Event Date/Time   First MD Initiated Contact with Patient 07/03/20 1228     (approximate)  I have reviewed the triage vital signs and the nursing notes.   HISTORY  Chief Complaint Abdominal Pain  HPI Patricia [facility] is a 20 y.o. female with no chronic medical history presents to the emergency department for treatment and evaluation of right mid and lower abdominal pain that started 4 days ago.  No fever or vomiting.  She has had some nausea.  Poor appetite.  Pain increases with any movement.  No alleviating measures attempted prior to arrival..         Past Medical History:  Diagnosis Date   COVID-19 virus detected    Jan. 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 Authorizing Provider  hyoscyamine (ANASPAZ) 0.125 MG TBDP disintergrating tablet Place 1 tablet (0.125 mg total) under the tongue every 6 (six) hours as needed. 07/03/20  Yes Keily Lepp B, FNP  Prenatal Vit-Fe Fumarate-FA (PRENATAL VITAMINS PO) Take 2 tablets by mouth. 2 gummies per day    [provider]    Allergies Patient has no known allergies.  Family History  Problem Relation Age of Onset   High Cholesterol  Maternal Grandmother    Hypertension Maternal Grandmother    Diabetes Maternal Grandmother    Asthma Sister    Asthma Brother    Hepatitis Maternal Grandfather     Social History Social History   Tobacco Use   Smoking status: Former    Pack years: 0.00   Smokeless tobacco: Never   Tobacco comments:    quit end of November 2020  Vaping Use   Vaping Use: Former  Substance Use Topics   Alcohol use: Yes    Comment: social   Drug use: Not Currently    Types: Marijuana    Comment: Before known pregnant approx. q. day-MJ    Review of Systems  Constitutional: No fever/chills Eyes: No visual changes. ENT: No sore throat. Cardiovascular: Denies chest pain. Respiratory: Denies shortness of breath. Gastrointestinal: Positive for abdominal pain.   positive for nausea, no vomiting.  No diarrhea.  No constipation. Genitourinary: Negative for dysuria. Musculoskeletal: Negative for back pain. Skin: Negative for rash. Neurological: Negative for headaches, focal weakness or numbness.  ____________________________________________   PHYSICAL EXAM:  VITAL SIGNS: ED Triage Vitals  Enc Vitals Group     BP 07/03/20 1109 130/64     Pulse Rate 07/03/20 1109 87     Resp 07/03/20 1109 16     Temp 07/03/20 1109 99.5 F (37.5 C)     Temp Source 07/03/20 1109 Oral     SpO2 07/03/20 1109 99 %     Weight 07/03/20 1107  170 lb (77.1 kg)     Height 07/03/20 1107 5\' 6"  (1.676 m)     Head Circumference --      Peak Flow --      Pain Score 07/03/20 1106 10     Pain Loc --      Pain Edu? --      Excl. in GC? --     Constitutional: Alert and oriented. Well appearing and in no acute distress. Eyes: Conjunctivae are normal. PERRL. EOMI. Head: Atraumatic. Nose: No congestion/rhinnorhea. Mouth/Throat: Mucous membranes are moist.  Oropharynx non-erythematous. Neck: No stridor.   Hematological/Lymphatic/Immunilogical: No cervical lymphadenopathy. Cardiovascular: Normal rate, regular rhythm.  Grossly normal heart sounds.  Good peripheral circulation. Respiratory: Normal respiratory effort.  No retractions. Lungs CTAB. Gastrointestinal: Soft and tender in the right mid and lower quadrant with light palpation. No distention. No abdominal bruits.  Genitourinary:  Musculoskeletal: No lower extremity tenderness nor edema.  No joint effusions. Neurologic:  Normal speech and language. No gross focal neurologic deficits are appreciated. No gait instability. Skin:  Skin is warm, dry and intact. No rash noted. Psychiatric: Mood and affect are normal. Speech and behavior are normal.  ____________________________________________   LABS (all labs ordered are listed, but only abnormal results are displayed)  Labs Reviewed  COMPREHENSIVE METABOLIC PANEL - Abnormal; Notable for the following components:      Result Value   Glucose, Bld 105 (*)    BUN 21 (*)    All other components within normal limits  CBC - Abnormal; Notable for the following components:   WBC 11.8 (*)    All other components within normal limits  URINALYSIS, COMPLETE (UACMP) WITH MICROSCOPIC - Abnormal; Notable for the following components:   Color, Urine YELLOW (*)    APPearance CLEAR (*)    Leukocytes,Ua TRACE (*)    All other components within normal limits  RESP PANEL BY RT-PCR (FLU A&B, COVID) ARPGX2  LIPASE, BLOOD  PREGNANCY, URINE   ____________________________________________  EKG  Not indicated ____________________________________________  RADIOLOGY  ED MD interpretation:    CT of the abdomen pelvis is negative for acute findings.  Specifically, no evidence of appendicitis.  I, 09/02/20, personally viewed and evaluated these images (plain radiographs) as part of my medical decision making, as well as reviewing the written report by the radiologist.  Official radiology report(s): No results found.  ____________________________________________   PROCEDURES  Procedure(s) performed  (including Critical Care):  Procedures  ____________________________________________   INITIAL IMPRESSION / ASSESSMENT AND PLAN     20 year old female presenting to the emergency department for treatment and evaluation of right mid and lower quadrant abdominal pain.  See HPI for further details.  While awaiting ER room assignment, protocol labs were drawn.  She does have a mild leukocytosis at 11.8.  Temperature measured at 99.5.  Plan will be to get a CT of the abdomen and pelvis to rule out appendicitis.  Patient is aware and agreeable with the plan.  DIFFERENTIAL DIAGNOSIS  Appendicitis, cholecystitis, diverticulitis, diverticulosis  ED COURSE  CT abdomen and pelvis is negative for acute concerns. She will be prescribed hyoscyamine and advised to follow up with PCP or return to the ER for symptoms of concern. She was observed ambulating out with out antalgic gait or guarding abdomen.     ___________________________________________   FINAL CLINICAL IMPRESSION(S) / ED DIAGNOSES  Final diagnoses:  Right lower quadrant abdominal pain     ED Discharge Orders  Ordered    hyoscyamine (ANASPAZ) 0.125 MG TBDP disintergrating tablet  Every 6 hours PRN        07/03/20 1631             GIANNE SHUGARS was evaluated in Emergency Department on 07/05/2020 for the symptoms described in the history of present illness. She was evaluated in the context of the global COVID-19 pandemic, which necessitated consideration that the patient might be at risk for infection with the SARS-CoV-2 virus that causes COVID-19. Institutional protocols and algorithms that pertain to the evaluation of patients at risk for COVID-19 are in a state of rapid change based on information released by regulatory bodies including the CDC and federal and state organizations. These policies and algorithms were followed during the patient's care in the ED.   Note:  This document was prepared using Dragon  voice recognition software and may include unintentional dictation errors.    Chinita Pester, FNP 07/05/20 1140    Minna Antis, MD 07/05/20 1611

## 2020-08-11 ENCOUNTER — Other Ambulatory Visit: Payer: Self-pay

## 2020-08-11 ENCOUNTER — Ambulatory Visit
Admission: EM | Admit: 2020-08-11 | Discharge: 2020-08-11 | Disposition: A | Discharge status: Home / Self Care | Payer: Medicaid Other | Attending: Sports Medicine | Admitting: Sports Medicine

## 2020-08-11 ENCOUNTER — Ambulatory Visit (INDEPENDENT_AMBULATORY_CARE_PROVIDER_SITE_OTHER): Payer: Medicaid Other

## 2020-08-11 ENCOUNTER — Encounter: Payer: Self-pay | Admitting: Emergency Medicine

## 2020-08-11 DIAGNOSIS — T148XXA Other injury of unspecified body region, initial encounter: Secondary | ICD-10-CM

## 2020-08-11 DIAGNOSIS — L03116 Cellulitis of left lower limb: Secondary | ICD-10-CM | POA: Diagnosis not present

## 2020-08-11 DIAGNOSIS — S8012XA Contusion of left lower leg, initial encounter: Secondary | ICD-10-CM | POA: Diagnosis not present

## 2020-08-11 DIAGNOSIS — M79605 Pain in left leg: Secondary | ICD-10-CM

## 2020-08-11 MED ORDER — SULFAMETHOXAZOLE-TRIMETHOPRIM 800-160 MG PO TABS: 1.0000 | ORAL_TABLET | Freq: Two times a day (BID) | ORAL | 0 refills | Status: AC

## 2020-08-11 NOTE — Discharge Instructions (Signed)
Take the Bactrim twice daily with food for 7 days.  Keep your left leg elevated as much as possible to help decrease the swelling and increase blood flow to help with healing.  Use OTC Tylenol and Ibuprofen according to the package instructions as needed for pain.  Return for new or worsening symptoms.
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
## 2020-09-28 ENCOUNTER — Ambulatory Visit: Payer: Medicaid Other

## 2020-10-13 ENCOUNTER — Encounter: Payer: Self-pay | Admitting: Family Medicine

## 2020-10-13 ENCOUNTER — Ambulatory Visit: Payer: Medicaid Other | Admitting: Family Medicine

## 2020-10-13 ENCOUNTER — Other Ambulatory Visit: Payer: Self-pay

## 2020-10-13 DIAGNOSIS — R768 Other specified abnormal immunological findings in serum: Secondary | ICD-10-CM

## 2020-10-13 DIAGNOSIS — N898 Other specified noninflammatory disorders of vagina: Secondary | ICD-10-CM

## 2020-10-13 DIAGNOSIS — Z113 Encounter for screening for infections with a predominantly sexual mode of transmission: Secondary | ICD-10-CM | POA: Diagnosis not present

## 2020-10-13 LAB — WET PREP FOR TRICH, YEAST, CLUE
Trichomonas Exam: NEGATIVE
Yeast Exam: NEGATIVE

## 2020-10-13 MED ORDER — CLOTRIMAZOLE 1 % VA CREA: 1.0000 | TOPICAL_CREAM | Freq: Every day | VAGINAL | 0 refills | Status: AC

## 2020-10-13 NOTE — Progress Notes (Signed)
Wet mount reviewed during clinic visit - no treatment indicated.   clotrimazole (V-R CLOTRIMAZOLE VAGINAL) 1 % vaginal cream dispensed per provider order for external labia use. Patient educated.   Floy Sabina, RN

## 2020-10-13 NOTE — Progress Notes (Signed)
[facility] Department STI clinic/screening visit  Subjective:  Patricia Kirby is a 20 y.o. female being seen today for an STI screening visit. The patient reports they do have symptoms.  Patient reports that they do not desire a pregnancy in the next year.   They reported they are not interested in discussing contraception today.  Patient's last menstrual period was 10/05/2020 (approximate).   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  Pt desires all STI testing.    Last HIV test per patient/review of record was 04828/2021 No previous pap for patient.    See flowsheet for further details and programmatic requirements.    The following portions of the patient's history were reviewed and updated as appropriate: allergies, current medications, past medical history, past social history, past surgical history and problem list.  Objective:  There were no vitals filed for this visit.  Physical Exam Vitals and nursing note reviewed.  Constitutional:      Appearance: Normal appearance.  HENT:     Head: Normocephalic and atraumatic.     Mouth/Throat:     Mouth: Mucous membranes are moist.     Pharynx: Oropharynx is clear. No oropharyngeal exudate or posterior oropharyngeal erythema.  Pulmonary:     Effort: Pulmonary effort is  normal.  Abdominal:     General: Abdomen is flat.     Palpations: There is no mass.     Tenderness: There is no abdominal tenderness. There is no rebound.  Genitourinary:    General: Normal vulva.     Exam position: Lithotomy position.     Pubic Area: No rash or pubic lice.      Labia:        Right: No rash or lesion.        Left: No rash or lesion.      Vagina: Normal. No vaginal discharge, erythema, bleeding or lesions.     Cervix: No cervical motion tenderness, discharge, friability, lesion or erythema.     Uterus: Normal.      Adnexa: Right adnexa normal and left adnexa normal.     Rectum: Normal.     Comments: External genitalia without, lice, nits, erythema, edema , lesions or inguinal adenopathy. Patient has dried diaper rash cream on external vaginal area.  Vagina with normal mucosa and discharge and pH equals 4.  Cervix without visual lesions, uterus firm, mobile, non-tender, no masses, CMT adnexal fullness or tenderness.   Lymphadenopathy:     Head:     Right side of head: No preauricular or posterior auricular adenopathy.     Left side of head: No preauricular or posterior auricular adenopathy.     Cervical: No cervical adenopathy.     Upper Body:     Right upper body: No supraclavicular or axillary adenopathy.     Left upper  body: No supraclavicular or axillary adenopathy.     Lower Body: No right inguinal adenopathy. No left inguinal adenopathy.  Skin:    General: Skin is warm and dry.     Findings: No rash.  Neurological:     Mental Status: She is alert and oriented to person, place, and time.  Psychiatric:        Mood and Affect: Mood normal.        Behavior: Behavior normal.     Assessment and Plan:  Patricia Kirby is a 20 y.o. female presenting to the [facility] Department for STI screening  1. Screening examination for venereal disease  Patient accepted all screenings including wet prep, vaginal CT/GC and bloodwork for HIV/RPR.  Patient  meets criteria for HepB screening? Yes. Ordered? No - behaviors were before last testing in 2021.   Patient meets criteria for HepC screening? Yes. Ordered? No - behaviors were before last testing in 2021.     Wet prep results neg    No Treatment needed per wet prep.   Discussed time line for [facility] results and that patient will be called with positive results and encouraged patient to call if she had not heard in 2 weeks.  Counseled to return or seek care for continued or worsening symptoms Recommended condom use with all sex  Patient is currently using  no BCM  to prevent pregnancy.   - HIV [place] [facility] - Syphilis Serology, [place] [place] [facility] - Chlamydia/Gonorrhea [place] [facility] - WET PREP FOR TRICH, YEAST, CLUE  2. Itching in the vaginal area  Pt to use clotrimazole on external vaginal area for itching.    - clotrimazole (V-R CLOTRIMAZOLE VAGINAL) 1 % vaginal cream; Place 1 Applicatorful vaginally at bedtime for 7 days.  Dispense: 45 g; Refill: 0   3. 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   Patient went to [facility] 1 month ago and syphilis came back reactive with non reactive confirmatory. Pt tested today.  Discussed with patient about biologic false positives.  Pt will be contacted with any information about today's testing.    Patient verbalizes understanding.     No follow-ups on file.  No future appointments.  Wendi Snipes, FNP

## 2020-10-19 ENCOUNTER — Telehealth: Payer: Self-pay

## 2020-10-19 ENCOUNTER — Encounter: Payer: Self-pay | Admitting: Physician Assistant

## 2020-10-19 ENCOUNTER — Telehealth: Payer: Self-pay | Admitting: Family Medicine

## 2020-10-19 NOTE — Telephone Encounter (Signed)
Calling pt regarding positive GC and positive chlamydia results from 10/13/20 vaginal specimen. Copying Family Planning/STD clinic just in case they get a phone call.  Phone call to pt. Left message that RN with [facility] is trying to get in touch with her. Please call Seletha Zimmermann at [phone].

## 2020-10-19 NOTE — Telephone Encounter (Signed)
Call patient to discuss ROR results.  No answer LMOM for patient to return call to [facility].    Pt password was [id number]   Wendi Snipes, FNP

## 2020-10-20 NOTE — Telephone Encounter (Signed)
Phone call to pt. Pt confirmed password from last visit. Pt counseled about positive GC and chlamydia results. Pt states she can come in on Friday due to work schedule. Pt scheduled for 10/22/20 in [facility] due to no availability in RN clinic. Pt will eat a meal before coming in, just in case she has to take some medication in clinic.

## 2020-10-22 ENCOUNTER — Other Ambulatory Visit: Payer: Self-pay

## 2020-10-22 ENCOUNTER — Ambulatory Visit: Payer: Medicaid Other | Admitting: Physician Assistant

## 2020-10-22 DIAGNOSIS — A5609 Other chlamydial infection of lower genitourinary tract: Secondary | ICD-10-CM

## 2020-10-22 DIAGNOSIS — A5602 Chlamydial vulvovaginitis: Secondary | ICD-10-CM

## 2020-10-22 DIAGNOSIS — Z113 Encounter for screening for infections with a predominantly sexual mode of transmission: Secondary | ICD-10-CM

## 2020-10-22 DIAGNOSIS — A5402 Gonococcal vulvovaginitis, unspecified: Secondary | ICD-10-CM

## 2020-10-22 MED ORDER — CEFTRIAXONE SODIUM 500 MG IJ SOLR
500.0000 mg | Freq: Once | INTRAMUSCULAR | Status: AC
  Administered 2020-10-22: 500 mg via INTRAMUSCULAR

## 2020-10-22 MED ORDER — DOXYCYCLINE HYCLATE 100 MG PO TABS: 100.0000 mg | ORAL_TABLET | Freq: Two times a day (BID) | ORAL | 0 refills | Status: AC

## 2020-10-25 ENCOUNTER — Encounter: Payer: Self-pay | Admitting: Physician Assistant

## 2020-10-25 NOTE — Progress Notes (Signed)
S:  Patient into clinic for treatment today.  Patient states last sex was 2 months ago and last period was 10/04/2020.  Patient denies any allergies.   O:  WDWN female, NAD, A&O x 3; GC and Chlamydia from last visit = both positive. A/P:  1.  Patient into clinic for treatment of GC and Chlamydia. 2.  Counseled patient that her Syphilis test done 10/13/2020, confirmed that she has a biologic false positive.  Counseled that it is likely that any time she is tested the Syphilis test would show this result, unless she does become infected with Syphilis. 3.  Will treat for GC and Chlamydia with Ceftriaxone 500 mg IM and Doxycycline 100 mg #14 1 po BID for 7 days. The patient was dispensed Doxycycline today. I provided counseling today regarding the medication. We discussed the medication, the side effects and when to call clinic. Patient given the opportunity to ask questions. Questions answered.   4.  No sex for 14 days and until after partner completes treatment. 5.  Contact cards given to patient for partner. 6.  Enc to use OTC antifungal cream if has any itching after antibiotic use. 7.  Enc condoms with all sex. 8.  Enc RTC in 3 months for re-screening.

## 2020-10-31 NOTE — Progress Notes (Signed)
Chart reviewed by Pharmacist  Suzanne Walker PharmD, Contract Pharmacist at [facility]  

## 2020-12-10 ENCOUNTER — Ambulatory Visit: Payer: Medicaid Other | Admitting: Family Medicine

## 2020-12-10 ENCOUNTER — Encounter: Payer: Self-pay | Admitting: Family Medicine

## 2020-12-10 ENCOUNTER — Other Ambulatory Visit: Payer: Self-pay

## 2020-12-10 DIAGNOSIS — Z113 Encounter for screening for infections with a predominantly sexual mode of transmission: Secondary | ICD-10-CM | POA: Diagnosis not present

## 2020-12-10 DIAGNOSIS — A749 Chlamydial infection, unspecified: Secondary | ICD-10-CM

## 2020-12-10 MED ORDER — AZITHROMYCIN 500 MG PO TABS
1000.0000 mg | ORAL_TABLET | Freq: Once | ORAL | Status: AC
  Administered 2020-12-10: 1000 mg via ORAL

## 2020-12-10 NOTE — Progress Notes (Signed)
[facility], [place] Department STI clinic/screening visit  Subjective:  Patricia Kirby is a 20 y.o. female being seen today for an STI screening visit. The patient reports they do not have symptoms.  Patient reports that they do not desire a pregnancy in the next year.   They reported they are not interested in discussing contraception today.  Patient's last menstrual period was 12/07/2020 (exact date).   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  Pt admits that she did not finish medications that was given on 10/22/20.  She lost the bottle and did not take the last 3 days.    Last HIV test per patient/review of record was 09/23/20 No previous pap.   See flowsheet for further details and programmatic requirements.    The following portions of the patient's history were reviewed and updated as appropriate: allergies, current medications, past medical history, past social history, past surgical history and problem list.  Objective:  There were no vitals filed for this visit.  Physical Exam Vitals and nursing note reviewed.  Constitutional:      Appearance: Normal appearance.  HENT:     Head: Normocephalic and atraumatic.     Mouth/Throat:     Mouth: Mucous membranes are moist.     Pharynx: Oropharynx is clear. No oropharyngeal exudate or posterior oropharyngeal erythema.  Pulmonary:     Effort: Pulmonary effort is normal.  Abdominal:     General: Abdomen is flat.     Palpations: There is no mass.     Tenderness: There is no abdominal tenderness.  There is no rebound.  Genitourinary:    Exam position: Lithotomy position.     Pubic Area: No rash or pubic lice.      Labia:        Right: No rash or lesion.        Left: No rash or lesion.      Vagina: Normal. No vaginal discharge, erythema, bleeding or lesions.     Cervix: No cervical motion tenderness, discharge, friability, lesion or erythema.     Uterus: Normal.      Adnexa: Right adnexa normal and left adnexa normal.     Comments: Deferred d/t previous testing  Lymphadenopathy:     Head:     Right side of head: No preauricular or posterior auricular adenopathy.     Left side of head: No preauricular or posterior auricular adenopathy.     Cervical: No cervical adenopathy.     Upper Body:     Right upper body: No supraclavicular or axillary adenopathy.     Left upper body: No supraclavicular or axillary adenopathy.     Lower Body: No right inguinal adenopathy. No left inguinal adenopathy.  Skin:    General: Skin is warm and dry.     Findings: No rash.  Neurological:     Mental Status: She is alert and oriented to person, place, and time.  Psychiatric:        Mood and Affect: Mood normal.  Behavior: Behavior normal.     Assessment and Plan:  Patricia Kirby is a 20 y.o. female presenting to the [facility] Department for STI screening  1. Screening examination for venereal disease Patient accepted  screenings for bloodwork for HIV/RPR.  No vaginal screening done d/t no sex since [facility] visit on 11/26/20.   Patient meets criteria for HepB screening? Yes. Ordered? No - no longer has behaviors  Patient meets criteria for HepC screening? Yes. Ordered? No - no longer has behaviors   Discussed time line for [facility] results and that patient will be called with positive results and encouraged patient to call if she had not heard in 2 weeks.  Counseled to return or seek care for continued or worsening symptoms Recommended condom use with all sex  Patient is  currently using  no BCM   to prevent pregnancy.   - HIV [facility] - Syphilis Serology, [facility]  2. Chlamydia  Pt had + chamydia on 11/26/20.  Pt has not been treated.  Pt did not complete PO medications given on 09/2020.  - azithromycin (ZITHROMAX) tablet 1,000 mg     Return for as needed.  No future appointments.  Wendi Snipes, FNP

## 2020-12-13 NOTE — Progress Notes (Signed)
Chart reviewed by Pharmacist  Suzanne Walker PharmD, Contract Pharmacist at [facility]  

## 2022-05-26 IMAGING — CR DG TIBIA/FIBULA 2V*L*
4 series · 4 of 4 positions shown · non-contrast
Comparison: None.

CLINICAL DATA: swelling/trauma

EXAM:
LEFT TIBIA AND FIBULA - 2 VIEW

[tibia ap (1 of 2)]
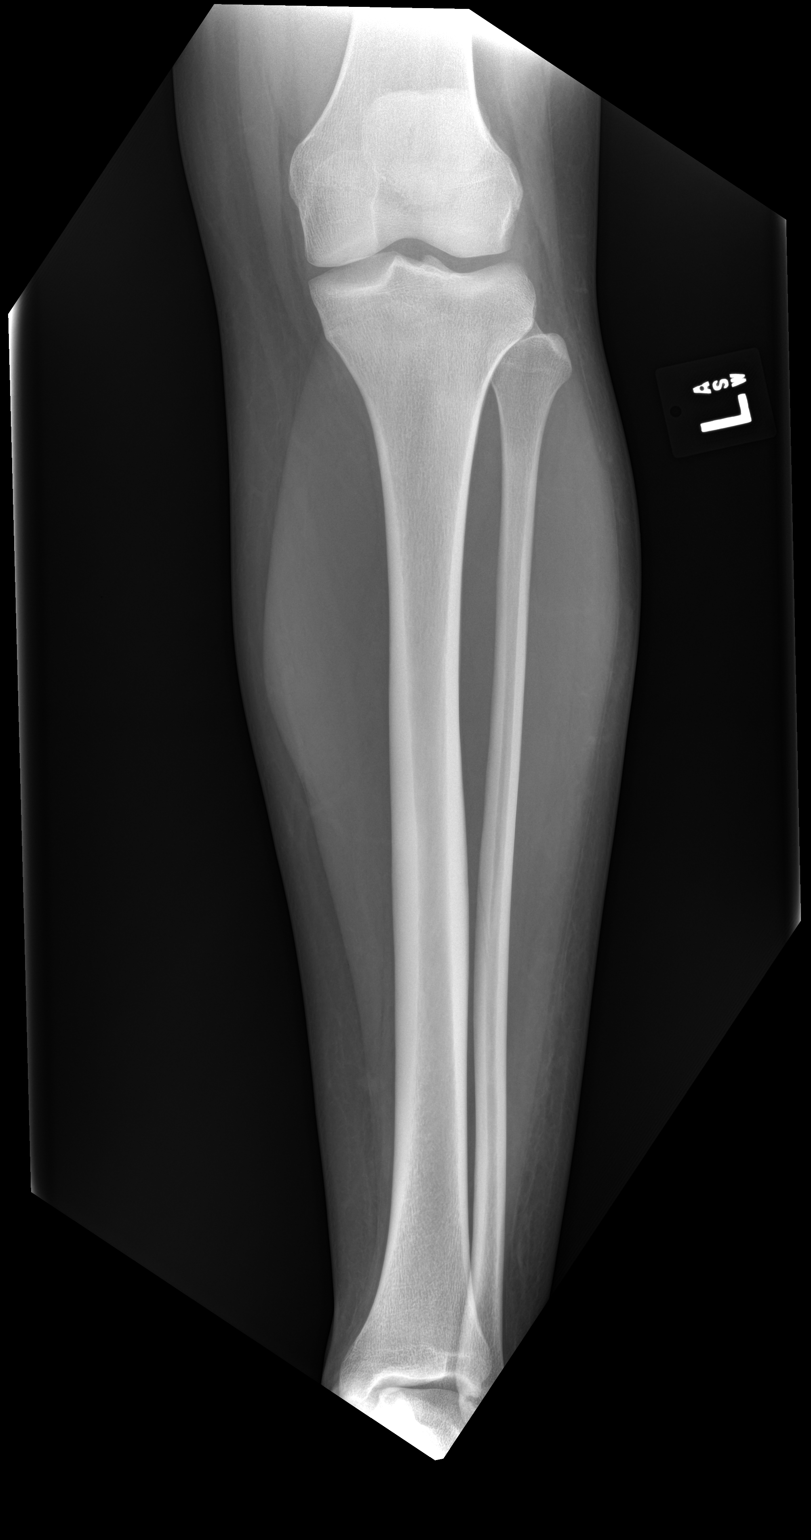

[tibia ap (2 of 2)]
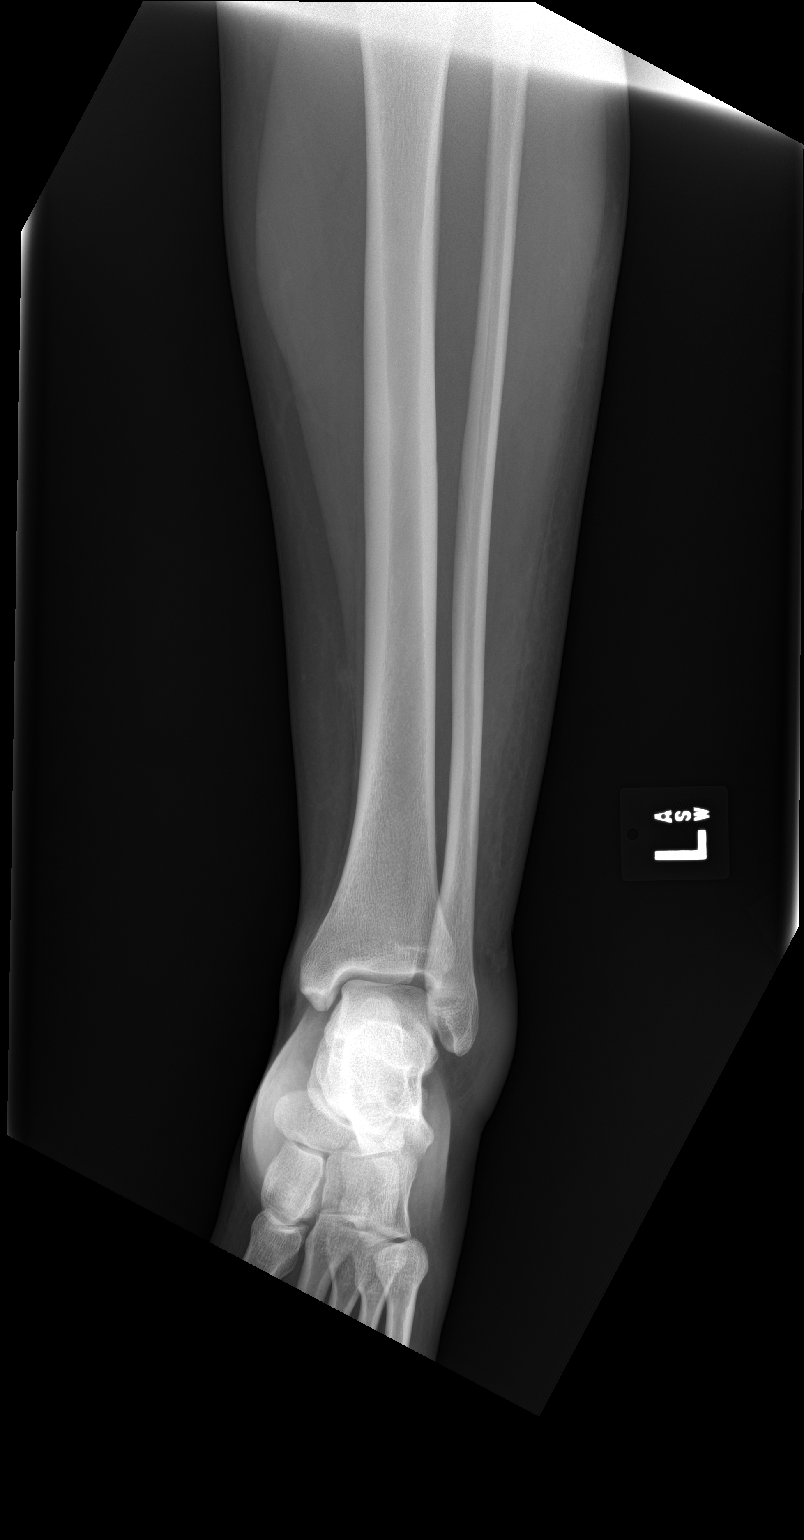

[tibia lat (1 of 2)]
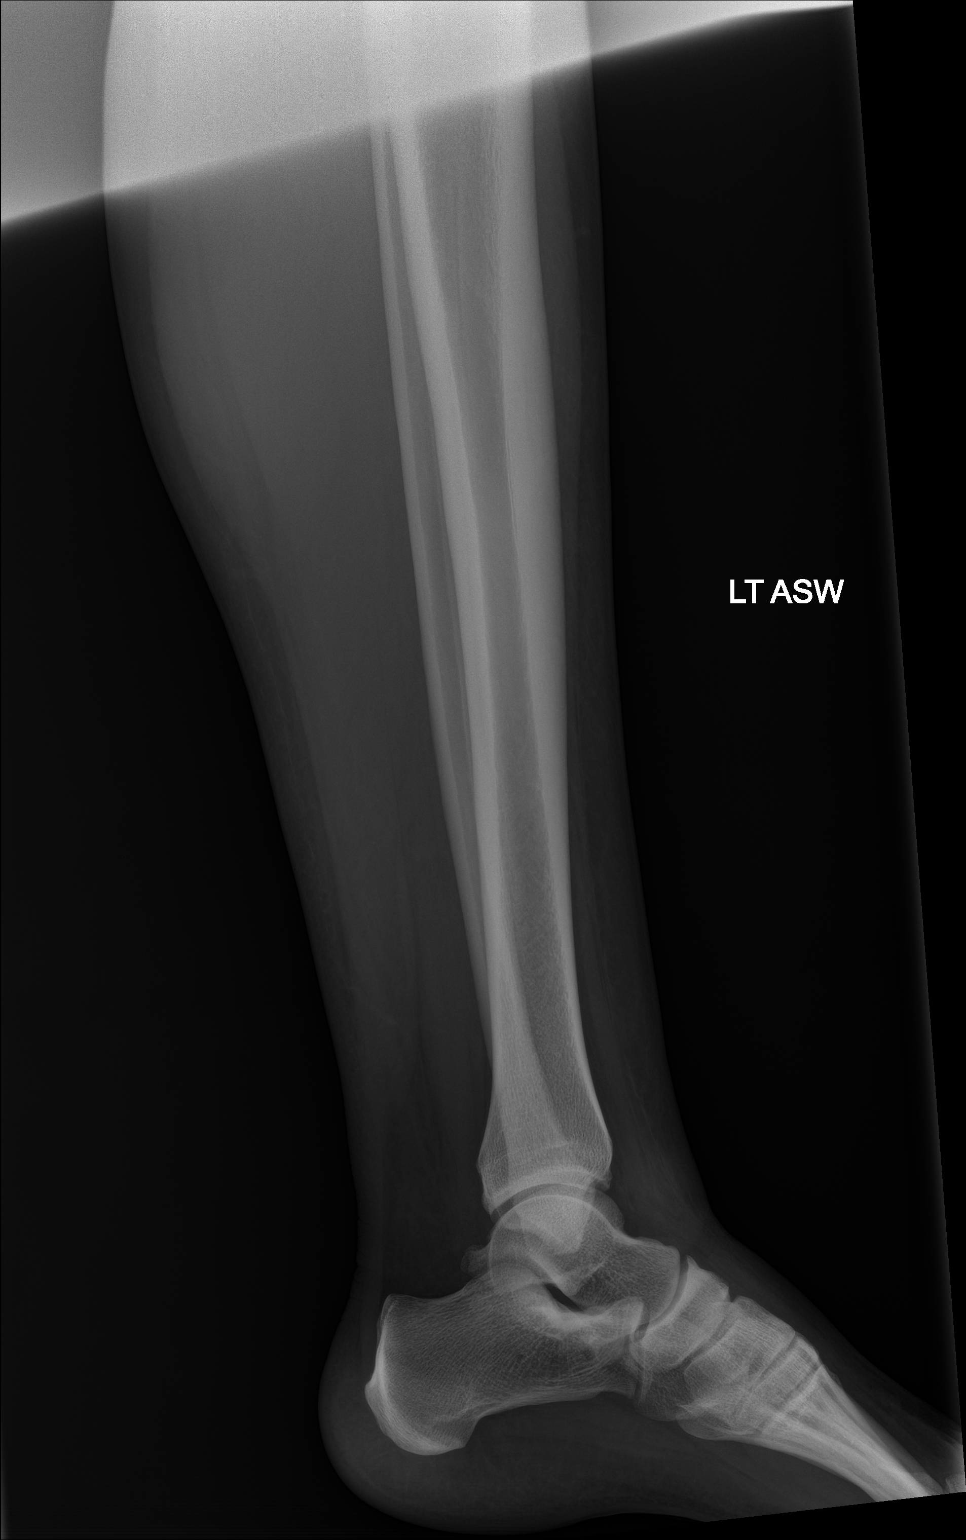

[tibia lat (2 of 2)]
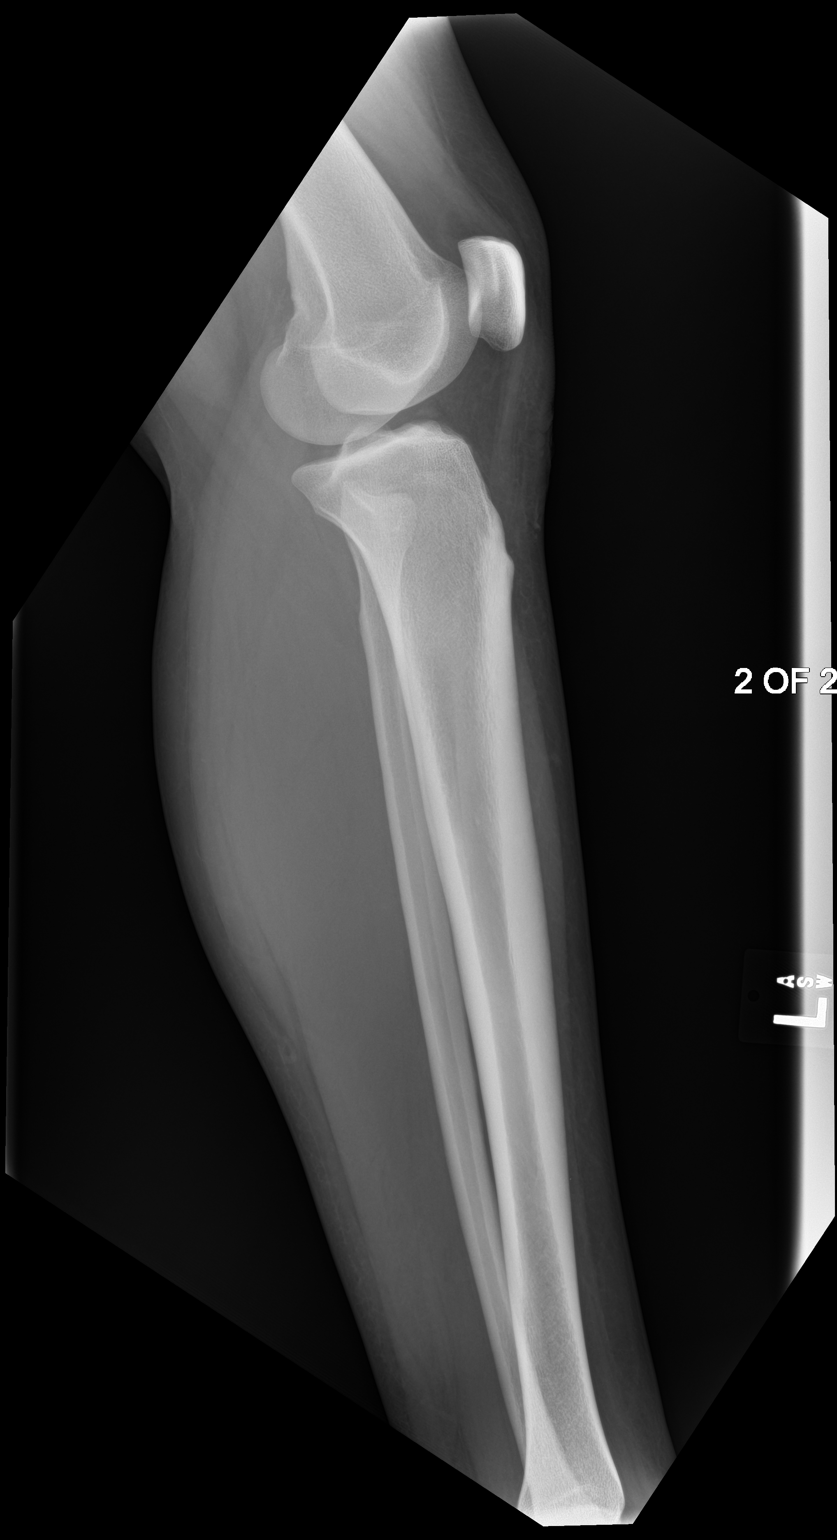

[4 of 4 positions shown; findings below may reference images not displayed]

FINDINGS: There is no evidence of fracture or other focal bone lesions. Soft
tissues are unremarkable.
IMPRESSION: Negative.

## 2023-05-15 ENCOUNTER — Other Ambulatory Visit: Payer: Self-pay

## 2023-05-15 ENCOUNTER — Encounter: Payer: Self-pay | Admitting: Obstetrics and Gynecology

## 2023-05-15 ENCOUNTER — Ambulatory Visit: Payer: Medicaid Other | Admitting: Obstetrics and Gynecology

## 2023-05-15 VITALS — BP 108/72 | HR 66

## 2023-05-15 DIAGNOSIS — N939 Abnormal uterine and vaginal bleeding, unspecified: Secondary | ICD-10-CM | POA: Diagnosis not present

## 2023-05-15 DIAGNOSIS — N914 Secondary oligomenorrhea: Secondary | ICD-10-CM | POA: Diagnosis not present

## 2023-05-15 NOTE — Progress Notes (Signed)
 NEW GYNECOLOGY PATIENT Patient name: Patricia Kirby MRN [id number]  Date of birth: 08/27/2000 Chief Complaint:   No chief complaint on file.     History:  Patricia Kirby is a 23 y.o. G1P0000 being seen today for AUB.  Irregular periods and only had regular periods while in high school on birth control up to 2 months withotu a menses. May have cramping without bleeding. No menses in march and not as of yet in April. Menses are 3-4 days; first 2 are heavy and then moderate. Having to wax facial hair every 3 days. Menarche at 23 years old. Stopped taking OCP due to weight gain. Does not see birth control as an alternative - doesn't care about seeing a period.  Does not want  to get pregnant, currently sexually active.  CHC contraindications: none Grandmother with history of breast cancer        Gynecologic History No LMP recorded. Contraception: none Last Pap: 10/18/2022  NILM Last Mammogram: n/a Last Colonoscopy: n/a  Obstetric History OB History  Gravida Para Term Preterm AB Living  1 0 0 0 0 0  SAB IAB Ectopic Multiple Live Births  0 0 0 0 0    # Outcome Date GA Lbr Len/2nd Weight Sex Type Anes PTL Lv  1 Gravida             Past Medical History:  Diagnosis Date   Abdominal pain in pregnancy, second trimester 04/07/2019   COVID-19 virus detected    Jan. 2021   High cholesterol    during teenage years   Indication for care in labor and delivery, antepartum 05/20/2019   Late prenatal care 03/28/2019   20 weeks   Preterm labor 05/20/2019   Supervision of normal first pregnancy, antepartum 03/28/2019    Nursing Staff Provider  Office Location  [facility] Dating  LMP  Language  English Anatomy US   3VC, anterior placenta, suboptimal genitalia view - needs repeat in 6 wks  Flu Vaccine   Genetic Screen  Quad: negative   TDaP vaccine    Hgb A1C or  GTT Early  Third trimester   Rhogam     LAB RESULTS   Feeding Plan  Blood Type B/Positive/-- (03/05 1100)   Contraception  Antibody Positive, See  Final Results     Past Surgical History:  Procedure Laterality Date   denies     Denies surgical history      Current Outpatient Medications on File Prior to Visit  Medication Sig Dispense Refill   Cholecalciferol 50 MCG (2000 UT) CAPS Take 1 capsule by mouth daily. (Patient not taking: Reported on 05/15/2023)     doxycycline  (VIBRAMYCIN ) 100 MG capsule Take 100 mg by mouth 2 (two) times daily. (Patient not taking: Reported on 05/15/2023)     sulfamethoxazole -trimethoprim  (BACTRIM  DS) 800-160 MG tablet SMARTSIG:1 Tablet(s) By Mouth Every 12 Hours (Patient not taking: Reported on 05/15/2023)     No current facility-administered medications on file prior to visit.    No Known Allergies  Social History:  reports that she has never smoked. She has never used smokeless tobacco. She reports current alcohol use of about 2.0 standard drinks of alcohol per week. She reports that she does not currently use drugs after having used the following drugs: Marijuana.  Family History  Problem Relation Age of Onset   High Cholesterol Maternal Grandmother    Hypertension Maternal Grandmother    Diabetes Maternal Grandmother    Asthma Sister  Asthma Brother    Hepatitis Maternal Grandfather     The following portions of the patient's history were reviewed and updated as appropriate: allergies, current medications, past family history, past medical history, past social history, past surgical history and problem list.  Review of Systems Pertinent items noted in HPI and remainder of comprehensive ROS otherwise negative.  Physical Exam:  BP 108/72   Pulse 66  Physical Exam Vitals and nursing note reviewed. Exam conducted with a chaperone present.  Constitutional:      Appearance: Normal appearance.  Cardiovascular:     Rate and Rhythm: Normal rate.  Pulmonary:     Effort: Pulmonary effort is normal.     Breath sounds: Normal breath sounds.  Chest:  Breasts:    Right: Normal.     Left:  Normal.  Neurological:     General: No focal deficit present.     Mental Status: She is alert and oriented to person, place, and time.  Psychiatric:        Mood and Affect: Mood normal.        Behavior: Behavior normal.        Thought Content: Thought content normal.        Judgment: Judgment normal.      Assessment and Plan:   1. Abnormal uterine bleeding (AUB) (Primary) 2. Secondary oligomenorrhea Patient has abnormal uterine bleeding. Will order abnormal uterine bleeding evaluation labs and pelvic ultrasound to evaluate for any structural gynecologic abnormalities.  Will contact patient with these results and plans for further evaluation/management. - CBC - Hemoglobin A1c - Prolactin - Testosterone ,Free and Total - TSH Rfx on Abnormal to Free T4 - US  PELVIC COMPLETE WITH TRANSVAGINAL; Future - Follicle stimulating hormone  Routine preventative health maintenance measures emphasized. Please refer to After Visit Summary for other counseling recommendations.   Follow-up: No follow-ups on file.      Kiki Pelton, MD Obstetrician & Gynecologist, Faculty [facility] for [employer], [facility]

## 2023-05-16 ENCOUNTER — Encounter: Payer: Self-pay | Admitting: Obstetrics and Gynecology

## 2023-05-16 LAB — CBC
Hematocrit: 42.3 % (ref 34.0–46.6)
Hemoglobin: 13.9 g/dL (ref 11.1–15.9)
MCH: 30 pg (ref 26.6–33.0)
MCHC: 32.9 g/dL (ref 31.5–35.7)
MCV: 91 fL (ref 79–97)
Platelets: 178 10*3/uL (ref 150–450)
RBC: 4.63 x10E6/uL (ref 3.77–5.28)
RDW: 13.5 % (ref 11.7–15.4)
WBC: 7.1 10*3/uL (ref 3.4–10.8)

## 2023-05-16 LAB — FOLLICLE STIMULATING HORMONE: FSH: 2.7 m[IU]/mL

## 2023-05-16 LAB — TESTOSTERONE,FREE AND TOTAL
Testosterone, Free: 5.7 pg/mL — ABNORMAL HIGH (ref 0.0–4.2)
Testosterone: 61 ng/dL (ref 13–71)

## 2023-05-16 LAB — HEMOGLOBIN A1C
Est. average glucose Bld gHb Est-mCnc: 111 mg/dL
Hgb A1c MFr Bld: 5.5 % (ref 4.8–5.6)

## 2023-05-16 LAB — TSH RFX ON ABNORMAL TO FREE T4: TSH: 1.46 u[IU]/mL (ref 0.450–4.500)

## 2023-05-16 LAB — POCT PREGNANCY, URINE: Preg Test, Ur: NEGATIVE

## 2023-05-16 LAB — PROLACTIN: Prolactin: 28 ng/mL (ref 4.8–33.4)

## 2023-05-25 ENCOUNTER — Ambulatory Visit (HOSPITAL_COMMUNITY)
Admission: RE | Admit: 2023-05-25 | Discharge: 2023-05-25 | Disposition: A | Discharge status: Home / Self Care | Source: Ambulatory Visit | Attending: Obstetrics and Gynecology | Admitting: Obstetrics and Gynecology

## 2023-05-25 DIAGNOSIS — N914 Secondary oligomenorrhea: Secondary | ICD-10-CM | POA: Insufficient documentation

## 2023-05-25 DIAGNOSIS — N939 Abnormal uterine and vaginal bleeding, unspecified: Secondary | ICD-10-CM | POA: Insufficient documentation

## 2023-05-28 ENCOUNTER — Encounter: Payer: Self-pay | Admitting: Obstetrics and Gynecology

## 2023-09-14 ENCOUNTER — Ambulatory Visit: Admitting: Nurse Practitioner

## 2023-09-14 NOTE — Progress Notes (Deleted)
 Name: Patricia Kirby   MRN: [id number]    DOB: April 12, 2000   Date:09/14/2023       Progress Note  Subjective  Chief Complaint  No chief complaint on file.   HPI  Patient presents for annual CPE.  Diet: *** Exercise: ***  Sleep: *** Last dental exam:*** Last eye exam: ***   Depression: Phq 9 is  {Desc; negative/positive:13464}    05/15/2023    5:04 PM 04/28/2019    4:01 PM 03/28/2019    9:28 AM 02/24/2019    8:58 AM  Depression screen PHQ 2/9  Decreased Interest 0 0 0 0  Down, Depressed, Hopeless 0 0 0 0  PHQ - 2 Score 0 0 0 0  Altered sleeping 0 0 0   Tired, decreased energy 0 0 0   Change in appetite 0 0 0   Feeling bad or failure about yourself  0 0 0   Trouble concentrating 0 0 0   Moving slowly or fidgety/restless 0 0 0   Suicidal thoughts 0 0 0   PHQ-9 Score 0 0 0    Hypertension: BP Readings from Last 3 Encounters:  05/15/23 108/72  08/11/20 114/73  07/03/20 130/64   Obesity: Wt Readings from Last 3 Encounters:  07/03/20 170 lb (77.1 kg)  04/13/20 172 lb (78 kg)  04/28/19 192 lb 6.4 oz (87.3 kg) (97%, Z= 1.82)*   * Growth percentiles are based on CDC (Girls, 2-20 Years) data.   BMI Readings from Last 3 Encounters:  07/03/20 27.44 kg/m  04/13/20 27.76 kg/m  04/28/19 31.05 kg/m (94%, Z= 1.59)*   * Growth percentiles are based on CDC (Girls, 2-20 Years) data.     Vaccines:  HPV: up to at age 90 , ask insurance if age between 30-45  Shingrix: 65-64 yo and ask insurance if covered when patient above 40 yo Pneumonia: *** educated and discussed with patient. Flu: *** educated and discussed with patient.  Hep C Screening: *** STD testing and prevention (HIV/chl/gon/syphilis): *** Intimate partner violence:*** Sexual History : Menstrual History/LMP/Abnormal Bleeding:  Incontinence Symptoms:   Breast cancer:  - Last Mammogram: *** - BRCA gene screening: ***  Osteoporosis: Discussed high calcium and vitamin D  supplementation, weight bearing  exercises  Cervical cancer screening: ***  Skin cancer: Discussed monitoring for atypical lesions  Colorectal cancer: ***   Lung cancer:  *** Low Dose CT Chest recommended if Age 20-80 years, 20 pack-year currently smoking OR have quit w/in 15years. Patient {DOES NOT does:27190::does not} qualify.   ECG: ***  Advanced Care Planning: A voluntary discussion about advance care planning including the explanation and discussion of advance directives.  Discussed health care proxy and Living will, and the patient was able to identify a health care proxy as ***.  Patient {DOES_DOES WNU:81435} have a living will at present time. If patient does have living will, I have requested they bring this to the clinic to be scanned in to their chart.  Lipids: Lab Results  Component Value Date   CHOL 178 (H) 09/13/2015   Lab Results  Component Value Date   HDL 50 09/13/2015   Lab Results  Component Value Date   LDLCALC 118 (H) 09/13/2015   Lab Results  Component Value Date   TRIG 48 09/13/2015   Lab Results  Component Value Date   CHOLHDL 3.6 09/13/2015   No results found for: LDLDIRECT  Glucose: Glucose, Bld  Date Value Ref Range Status  07/03/2020 105 (H) 70 - 99  mg/dL Final    Comment:    Glucose reference range applies only to samples taken after fasting for at least 8 hours.  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   History of marijuana use 03/28/2019    Past Surgical History:  Procedure Laterality Date   denies     Denies surgical history      Family History  Problem Relation Age of Onset   High Cholesterol Maternal Grandmother    Hypertension Maternal Grandmother    Diabetes Maternal Grandmother    Asthma Sister    Asthma Brother    Hepatitis Maternal Grandfather      Social History   Socioeconomic History   Marital status: Single    Spouse name: Not on file   Number of children: 0   Years of education: 12   Highest education level: Not on file  Occupational History   Not on file  Tobacco Use   Smoking status: Never   Smokeless tobacco: Never  Vaping Use   Vaping status: Former   Substances: Nicotine, Flavoring  Substance and Sexual Activity   Alcohol use: Yes    Alcohol/week: 2.0 standard drinks of alcohol    Types: 2 Standard drinks or equivalent per week    Comment: social   Drug use: Not Currently    Types: Marijuana    Comment: last used 2020   Sexual activity: Yes    Birth control/protection: Condom  Other Topics Concern   Not on file  Social History Narrative   Not on file   Social Drivers of Health   Financial Resource Strain: Low Risk  (10/23/2022)   Received from [facility]   Overall Financial Resource Strain (CARDIA)    Difficulty of Paying Living Expenses: Not very hard  Food Insecurity: No Food Insecurity (10/23/2022)   Received from [facility]   Hunger Vital Sign    Worried About Running Out of Food in the Last Year: Never true    Ran Out of Food in the Last Year: Never true  Transportation Needs: No Transportation Needs (10/23/2022)   Received from [facility] - Transportation    Lack of Transportation (Medical): No    Lack of Transportation (Non-Medical): No  Physical Activity: Unknown (10/23/2022)   Received from [facility]   Exercise Vital Sign    Days of Exercise per Week: 0 days    Minutes of Exercise per Session: Not on file  Stress: No Stress Concern Present (10/23/2022)   Received from [facility] - Occupational Stress Questionnaire    Feeling of Stress : Only a little  Social Connections: Socially Integrated (10/23/2022)   [facility] from [facility]   Social Network    How would you rate your social network (family, work, friends)?:  Good participation with social networks  Intimate Partner Violence: Not At Risk (10/23/2022)   Received from [facility]   HITS    Over the last 12 months how often did your partner physically hurt you?: Never    Over the last 12 months how often did your partner insult you or talk down to you?: Never    Over the last 12 months how often did your partner threaten you with physical harm?:  Never    Over the last 12 months how often did your partner scream or curse at you?: Never     Current Outpatient Medications:    Cholecalciferol 50 MCG (2000 UT) CAPS, Take 1 capsule by mouth daily. (Patient not taking: Reported on 05/15/2023), Disp: , Rfl:    doxycycline  (VIBRAMYCIN ) 100 MG capsule, Take 100 mg by mouth 2 (two) times daily. (Patient not taking: Reported on 05/15/2023), Disp: , Rfl:    sulfamethoxazole -trimethoprim  (BACTRIM  DS) 800-160 MG tablet, SMARTSIG:1 Tablet(s) By Mouth Every 12 Hours (Patient not taking: Reported on 05/15/2023), Disp: , Rfl:   No Known Allergies   ROS  ***  Objective  There were no vitals filed for this visit.  There is no height or weight on file to calculate BMI.  Physical Exam ***  No results found for this or any previous visit (from the past 2160 hours).  Diabetic Foot Exam: Diabetic Foot Exam - Simple   No data filed    ***  Fall Risk:     No data to display         ***  Functional Status Survey:   ***  Assessment & Plan  There are no diagnoses linked to this encounter.  -USPSTF grade A and B recommendations reviewed with patient; age-appropriate recommendations, preventive care, screening tests, etc discussed and encouraged; healthy living encouraged; see AVS for patient education given to patient -Discussed importance of 150 minutes of physical activity weekly, eat two servings of fish weekly, eat one serving of tree nuts ( cashews, pistachios, pecans, almonds.SABRA) every other day, eat 6 servings of fruit/vegetables daily and  drink plenty of water and avoid sweet beverages.   -Reviewed Health Maintenance: ***
